# Patient Record
Sex: Female | Born: 1970
Health system: Southern US, Community
[De-identification: ages and names within clinical notes are randomized; demographics above are authoritative.]

## PROBLEM LIST (undated history)

## (undated) DIAGNOSIS — L509 Urticaria, unspecified: Secondary | ICD-10-CM

## (undated) DIAGNOSIS — T783XXA Angioneurotic edema, initial encounter: Secondary | ICD-10-CM

## (undated) DIAGNOSIS — L309 Dermatitis, unspecified: Secondary | ICD-10-CM

## (undated) HISTORY — PX: TURBINATE REDUCTION: SHX6157

## (undated) HISTORY — PX: BUNIONECTOMY: SHX129

## (undated) HISTORY — PX: SEPTOPLASTY: SUR1290

## (undated) HISTORY — DX: Urticaria, unspecified: L50.9

## (undated) HISTORY — DX: Angioneurotic edema, initial encounter: T78.3XXA

## (undated) HISTORY — PX: SINOSCOPY: SHX187

## (undated) HISTORY — DX: Dermatitis, unspecified: L30.9

---

## 1995-03-23 HISTORY — PX: TUBAL LIGATION: SHX77

## 1997-03-22 HISTORY — PX: TONSILLECTOMY: SUR1361

## 2008-03-22 HISTORY — PX: OTHER SURGICAL HISTORY: SHX169

## 2017-10-24 MED FILL — MONTELUKAST SOD 10 MG TAB: 10 | 30 days supply | Qty: 30 | Fill #0

## 2017-11-04 MED FILL — CONCERTA 54 MG TABLET ER: 54 | 30 days supply | Qty: 30 | Fill #0

## 2017-11-15 ENCOUNTER — Ambulatory Visit: Payer: 59 | Admitting: Allergy and Immunology

## 2017-11-15 ENCOUNTER — Encounter: Payer: Self-pay | Admitting: Allergy and Immunology

## 2017-11-15 VITALS — BP 128/90 | HR 63 | Temp 98.2°F | Resp 16 | Ht 68.25 in | Wt 181.4 lb

## 2017-11-15 DIAGNOSIS — J3089 Other allergic rhinitis: Secondary | ICD-10-CM | POA: Diagnosis not present

## 2017-11-15 DIAGNOSIS — H1010 Acute atopic conjunctivitis, unspecified eye: Secondary | ICD-10-CM | POA: Insufficient documentation

## 2017-11-15 DIAGNOSIS — H1013 Acute atopic conjunctivitis, bilateral: Secondary | ICD-10-CM | POA: Diagnosis not present

## 2017-11-15 MED ORDER — OLOPATADINE HCL 0.2 % OP SOLN: 1.0000 [drp] | OPHTHALMIC | 5 refills | Status: DC

## 2017-11-15 NOTE — Progress Notes (Signed)
New Patient Note  RE: Nancy Powers MRN: [id number] DOB: [birth year] Date of Office Visit: 11/15/2017  Referring provider: Ardelle Powers, Nancy D, MD Primary care provider: Ardelle Powers, Nancy D, MD  Chief Complaint: Allergic Rhinitis  and Conjunctivitis   History of present illness: Nancy Powers is a 47 y.o. female presenting today for evaluation of allergic rhinitis.  He moved from [place] to [address] [place] in July 2019.  She has started aeroallergen immunotherapy in [place] in May 2019 and had her last injection on July 20.  She believes that she has started to notice therapy and would like to restart immunotherapy.  She has a nasal septum perforation with multiple failed attempts at repair and therefore is unable to use nasal steroid sprays.  She takes levocetirizine as needed to help control her nasal and ocular allergy symptoms.  Assessment and plan: Other allergic rhinitis  The patient will resume aeroallergen immunotherapy under our care.  Risk and benefits have been discussed and consent forms have been signed.  Orders for aeroallergen immunotherapy vials have been written based upon her skin test results from May 2019.  For now, continue appropriate allergen avoidance measures as well as montelukast, levocetirizine, and/or nasal saline rinse as needed.  Return for follow-up in 6 months, or sooner if necessary.  Allergic conjunctivitis  Treatment plan as outlined above for allergic rhinitis.  A prescription has been provided for Pataday, one drop per eye daily as needed.  Eye lubricant drops (i.e., Natural Tears) as needed.   Meds ordered this encounter  Medications  . Olopatadine HCl (PATADAY) 0.2 % SOLN    Sig: Place 1 drop into both eyes 1 day or 1 dose.    Dispense:  1 Bottle    Refill:  5      Physical examination: Blood pressure 128/90, pulse 63, temperature 98.2 F (36.8 C), temperature source Oral, resp. rate 16, height 5' 8.25" (1.734 m), weight 181 lb 6.4  oz (82.3 kg), SpO2 99 %.  General: Alert, interactive, in no acute distress. HEENT: TMs pearly gray, turbinates moderately edematous with clear discharge, post-pharynx mildly erythematous. Neck: Supple without lymphadenopathy. Lungs: Clear to auscultation without wheezing, rhonchi or rales. CV: Normal S1, S2 without murmurs. Abdomen: Nondistended, nontender. Skin: Warm and dry, without lesions or rashes. Extremities:  No clubbing, cyanosis or edema. Neuro:   Grossly intact.  Review of systems:  Review of systems negative except as noted in HPI / PMHx or noted below: Review of Systems  Constitutional: Negative.   HENT: Negative.   Eyes: Negative.   Respiratory: Negative.   Cardiovascular: Negative.   Gastrointestinal: Negative.   Genitourinary: Negative.   Musculoskeletal: Negative.   Skin: Negative.   Neurological: Negative.   Endo/Heme/Allergies: Negative.   Psychiatric/Behavioral: Negative.     Past medical history:  Past Medical History:  Diagnosis Date  . Angio-edema   . Eczema   . Urticaria     Past surgical history:  Past Surgical History:  Procedure Laterality Date  . BUNIONECTOMY    . CERVICAL FUSION N/A 2010  . SEPTOPLASTY    . SEPTOPLASTY REPAIR    . SINOSCOPY    . TONSILLECTOMY    . TONSILLECTOMY Bilateral 1999  . TONSILLECTOMY Bilateral 1999  . TUBAL LIGATION Bilateral 1997  . TURBINATE REDUCTION      Family history: History reviewed. No pertinent family history.  Social history: Social History   Socioeconomic History  . Marital status: Married    Spouse name: Not on file  .  Number of children: Not on file  . Years of education: Not on file  . Highest education level: Not on file  Occupational History  . Not on file  Social Needs  . Financial resource strain: Not on file  . Food insecurity:    Worry: Not on file    Inability: Not on file  . Transportation needs:    Medical: Not on file    Non-medical: Not on file  Tobacco Use  .  Smoking status: Never Smoker  . Smokeless tobacco: Never Used  Substance and Sexual Activity  . Alcohol use: Yes    Comment: Occasionally   . Drug use: Never  . Sexual activity: Not on file  Lifestyle  . Physical activity:    Days per week: Not on file    Minutes per session: Not on file  . Stress: Not on file  Relationships  . Social connections:    Talks on phone: Not on file    Gets together: Not on file    Attends religious service: Not on file    Active member of club or organization: Not on file    Attends meetings of clubs or organizations: Not on file    Relationship status: Not on file  . Intimate partner violence:    Fear of current or ex partner: Not on file    Emotionally abused: Not on file    Physically abused: Not on file    Forced sexual activity: Not on file  Other Topics Concern  . Not on file  Social History Narrative  . Not on file   Environmental History: The patient lives in an apartment with carpeting the bedroom and central air/heat.  She is a non-smoker without pets.  There is no known mold/water damage in the home.  Allergies as of 11/15/2017   Not on File     Medication List        Accurate as of 11/15/17  6:59 PM. Always use your most recent med list.          Fish Oil 1000 MG Caps Take by mouth.   ipratropium 0.03 % nasal spray Commonly known as:  ATROVENT Place 1 spray into both nostrils every 12 (twelve) hours.   levocetirizine 5 MG tablet Commonly known as:  XYZAL Take 5 mg by mouth every evening.   losartan 100 MG tablet Commonly known as:  COZAAR Take 100 mg by mouth daily.   methylphenidate 54 MG CR tablet Commonly known as:  CONCERTA Take 54 mg by mouth every morning.   montelukast 5 MG chewable tablet Commonly known as:  SINGULAIR Chew 5 mg by mouth at bedtime.   NASOGEL Gel Place 1 spray into the nose as needed.   Olopatadine HCl 0.2 % Soln Place 1 drop into both eyes 1 day or 1 dose.   RHINOCORT ALLERGY 32  MCG/ACT nasal spray Generic drug:  budesonide Place 1 spray into both nostrils daily.       Known medication allergies: Not on File  I appreciate the opportunity to take part in Nancy Powers's care. Please do not hesitate to contact me with questions.  Sincerely,   R. Jorene Guest, MD

## 2017-11-15 NOTE — Patient Instructions (Addendum)
Other allergic rhinitis  The patient will resume aeroallergen immunotherapy under our care.  Risk and benefits have been discussed and consent forms have been signed.  Orders for aeroallergen immunotherapy vials have been written based upon her skin test results from May 2019.  For now, continue appropriate allergen avoidance measures as well as montelukast, levocetirizine, and/or nasal saline rinse as needed.  Return for follow-up in 6 months, or sooner if necessary.  Allergic conjunctivitis  Treatment plan as outlined above for allergic rhinitis.  A prescription has been provided for Pataday, one drop per eye daily as needed.  Eye lubricant drops (i.e., Natural Tears) as needed.   Return in about 6 months (around 05/18/2018), or if symptoms worsen or fail to improve.

## 2017-11-15 NOTE — Assessment & Plan Note (Addendum)
   The patient will resume aeroallergen immunotherapy under our care.  Risk and benefits have been discussed and consent forms have been signed.  Orders for aeroallergen immunotherapy vials have been written based upon her skin test results from May 2019.  For now, continue appropriate allergen avoidance measures as well as montelukast, levocetirizine, and/or nasal saline rinse as needed.  Return for follow-up in 6 months, or sooner if necessary.

## 2017-11-15 NOTE — Assessment & Plan Note (Addendum)
   Treatment plan as outlined above for allergic rhinitis.  A prescription has been provided for Pataday, one drop per eye daily as needed.  Eye lubricant drops (i.e., Natural Tears) as needed. 

## 2017-11-16 NOTE — Progress Notes (Signed)
VIALS EXP 11-17-18 

## 2017-11-23 DIAGNOSIS — J301 Allergic rhinitis due to pollen: Secondary | ICD-10-CM

## 2017-11-23 MED FILL — MONTELUKAST SOD 10 MG TAB: 10 | 30 days supply | Qty: 30 | Fill #1

## 2017-11-24 DIAGNOSIS — J3089 Other allergic rhinitis: Secondary | ICD-10-CM

## 2017-11-24 MED FILL — OLOPATADINE HCL 0.2% EYE DR: 0.2 | 25 days supply | Qty: 3 | Fill #0

## 2017-12-05 ENCOUNTER — Other Ambulatory Visit: Payer: Self-pay | Admitting: *Deleted

## 2017-12-05 MED ORDER — EPINEPHRINE 0.3 MG/0.3ML IJ SOAJ: 0.3000 mg | Freq: Once | INTRAMUSCULAR | 1 refills | Status: AC

## 2017-12-05 MED ORDER — EPINEPHRINE 0.3 MG/0.3ML IJ SOAJ: 0.3000 mg | Freq: Once | INTRAMUSCULAR | 1 refills | Status: DC

## 2017-12-05 NOTE — Addendum Note (Signed)
Addended by: Dub MikesHICKS, ASHLEY N on: 12/05/2017 05:38 PM   Modules accepted: Orders

## 2017-12-06 ENCOUNTER — Other Ambulatory Visit: Payer: Self-pay | Admitting: *Deleted

## 2017-12-06 ENCOUNTER — Ambulatory Visit (INDEPENDENT_AMBULATORY_CARE_PROVIDER_SITE_OTHER): Payer: 59 | Admitting: *Deleted

## 2017-12-06 DIAGNOSIS — J309 Allergic rhinitis, unspecified: Secondary | ICD-10-CM

## 2017-12-06 MED ORDER — EPINEPHRINE 0.3 MG/0.3ML IJ SOAJ: 0.3000 mg | Freq: Once | INTRAMUSCULAR | 0 refills | Status: AC

## 2017-12-06 NOTE — Progress Notes (Signed)
Immunotherapy   Patient Details  Name: Nancy Powers MRN: [id number] Date of Birth: October 22, 1970  12/06/2017  Nancy Powers started injections for  Grass-Weed-Tree-Dmite & Mold-Cat-Cockroach. Following schedule: A  Frequency:2 times per week Epi-Pen:Epi-Pen Available  Consent signed and patient instructions given. No problems after 30 minutes in the office.   Mariane DuvalHeather L Vernon 12/06/2017, 2:56 PM

## 2017-12-13 MED FILL — LOSARTAN POTASSIUM 100 MG T: 100 | 90 days supply | Qty: 90 | Fill #0

## 2017-12-13 MED FILL — CONCERTA 54 MG TABLET ER: 54 | 30 days supply | Qty: 30 | Fill #0

## 2017-12-15 ENCOUNTER — Ambulatory Visit (INDEPENDENT_AMBULATORY_CARE_PROVIDER_SITE_OTHER): Payer: 59 | Admitting: *Deleted

## 2017-12-15 DIAGNOSIS — J309 Allergic rhinitis, unspecified: Secondary | ICD-10-CM

## 2017-12-20 DIAGNOSIS — N6012 Diffuse cystic mastopathy of left breast: Secondary | ICD-10-CM | POA: Diagnosis not present

## 2017-12-20 DIAGNOSIS — F419 Anxiety disorder, unspecified: Secondary | ICD-10-CM | POA: Diagnosis not present

## 2017-12-20 DIAGNOSIS — N6011 Diffuse cystic mastopathy of right breast: Secondary | ICD-10-CM | POA: Diagnosis not present

## 2017-12-20 DIAGNOSIS — I10 Essential (primary) hypertension: Secondary | ICD-10-CM | POA: Diagnosis not present

## 2017-12-20 DIAGNOSIS — F900 Attention-deficit hyperactivity disorder, predominantly inattentive type: Secondary | ICD-10-CM | POA: Diagnosis not present

## 2017-12-20 DIAGNOSIS — N3281 Overactive bladder: Secondary | ICD-10-CM | POA: Diagnosis not present

## 2017-12-20 MED FILL — OXYBUTYNIN CL ER 10 MG TAB: 10 | 30 days supply | Qty: 30 | Fill #0

## 2017-12-20 MED FILL — SERTRALINE HCL 25 MG TABLET: 25 | 30 days supply | Qty: 30 | Fill #0

## 2017-12-21 MED FILL — MONTELUKAST SOD 10 MG TAB: 10 | 90 days supply | Qty: 90 | Fill #2

## 2017-12-22 ENCOUNTER — Other Ambulatory Visit: Payer: Self-pay | Admitting: Family Medicine

## 2017-12-22 ENCOUNTER — Ambulatory Visit (INDEPENDENT_AMBULATORY_CARE_PROVIDER_SITE_OTHER): Payer: 59 | Admitting: *Deleted

## 2017-12-22 DIAGNOSIS — N6011 Diffuse cystic mastopathy of right breast: Secondary | ICD-10-CM

## 2017-12-22 DIAGNOSIS — J309 Allergic rhinitis, unspecified: Secondary | ICD-10-CM | POA: Diagnosis not present

## 2018-01-04 ENCOUNTER — Ambulatory Visit (INDEPENDENT_AMBULATORY_CARE_PROVIDER_SITE_OTHER): Payer: 59 | Admitting: *Deleted

## 2018-01-04 DIAGNOSIS — J309 Allergic rhinitis, unspecified: Secondary | ICD-10-CM

## 2018-01-12 ENCOUNTER — Ambulatory Visit
Admission: RE | Admit: 2018-01-12 | Discharge: 2018-01-12 | Disposition: A | Discharge status: Home / Self Care | Payer: 59 | Source: Ambulatory Visit | Attending: Family Medicine | Admitting: Family Medicine

## 2018-01-12 ENCOUNTER — Ambulatory Visit: Payer: Self-pay

## 2018-01-12 ENCOUNTER — Ambulatory Visit (INDEPENDENT_AMBULATORY_CARE_PROVIDER_SITE_OTHER): Payer: 59

## 2018-01-12 DIAGNOSIS — J309 Allergic rhinitis, unspecified: Secondary | ICD-10-CM | POA: Diagnosis not present

## 2018-01-12 DIAGNOSIS — N888 Other specified noninflammatory disorders of cervix uteri: Secondary | ICD-10-CM | POA: Diagnosis not present

## 2018-01-12 DIAGNOSIS — N6001 Solitary cyst of right breast: Secondary | ICD-10-CM | POA: Diagnosis not present

## 2018-01-12 DIAGNOSIS — R922 Inconclusive mammogram: Secondary | ICD-10-CM | POA: Diagnosis not present

## 2018-01-12 DIAGNOSIS — N898 Other specified noninflammatory disorders of vagina: Secondary | ICD-10-CM | POA: Diagnosis not present

## 2018-01-12 DIAGNOSIS — N6011 Diffuse cystic mastopathy of right breast: Secondary | ICD-10-CM

## 2018-01-18 MED FILL — OXYBUTYNIN CL ER 10 MG TAB: 10 | 30 days supply | Qty: 30 | Fill #1

## 2018-01-18 MED FILL — SERTRALINE HCL 25 MG TABLET: 25 | 30 days supply | Qty: 30 | Fill #1

## 2018-01-19 ENCOUNTER — Ambulatory Visit (INDEPENDENT_AMBULATORY_CARE_PROVIDER_SITE_OTHER): Payer: 59 | Admitting: *Deleted

## 2018-01-19 DIAGNOSIS — J309 Allergic rhinitis, unspecified: Secondary | ICD-10-CM

## 2018-01-26 ENCOUNTER — Telehealth: Payer: Self-pay | Admitting: Allergy and Immunology

## 2018-01-26 ENCOUNTER — Ambulatory Visit (INDEPENDENT_AMBULATORY_CARE_PROVIDER_SITE_OTHER): Payer: 59 | Admitting: *Deleted

## 2018-01-26 DIAGNOSIS — J309 Allergic rhinitis, unspecified: Secondary | ICD-10-CM | POA: Diagnosis not present

## 2018-01-26 MED ORDER — IPRATROPIUM BROMIDE 0.03 % NA SOLN: 1.0000 | Freq: Two times a day (BID) | NASAL | 5 refills | Status: DC

## 2018-01-26 NOTE — Telephone Encounter (Signed)
Pt came up to me and needs to have Atrovent called into [facility] on [address] [phone]

## 2018-01-26 NOTE — Telephone Encounter (Signed)
Script sent into pharmacy. Patient aware.

## 2018-01-27 MED FILL — IPRATROPIUM 0.03% SPRAY: 0.03 | 86 days supply | Qty: 30 | Fill #0

## 2018-02-01 DIAGNOSIS — F900 Attention-deficit hyperactivity disorder, predominantly inattentive type: Secondary | ICD-10-CM | POA: Diagnosis not present

## 2018-02-01 DIAGNOSIS — N3281 Overactive bladder: Secondary | ICD-10-CM | POA: Diagnosis not present

## 2018-02-01 DIAGNOSIS — I10 Essential (primary) hypertension: Secondary | ICD-10-CM | POA: Diagnosis not present

## 2018-02-01 DIAGNOSIS — F419 Anxiety disorder, unspecified: Secondary | ICD-10-CM | POA: Diagnosis not present

## 2018-02-01 MED FILL — CONCERTA 54 MG TABLET ER: 54 | 30 days supply | Qty: 30 | Fill #0

## 2018-02-02 ENCOUNTER — Ambulatory Visit (INDEPENDENT_AMBULATORY_CARE_PROVIDER_SITE_OTHER): Payer: 59 | Admitting: *Deleted

## 2018-02-02 DIAGNOSIS — J309 Allergic rhinitis, unspecified: Secondary | ICD-10-CM | POA: Diagnosis not present

## 2018-02-06 MED FILL — SERTRALINE HCL 50 MG TABLET: 50 | 90 days supply | Qty: 90 | Fill #0

## 2018-02-08 MED FILL — LEVOCETIRIZINE 5 MG TABLET: 5 | 90 days supply | Qty: 90 | Fill #0

## 2018-02-10 ENCOUNTER — Ambulatory Visit (INDEPENDENT_AMBULATORY_CARE_PROVIDER_SITE_OTHER): Payer: 59

## 2018-02-10 DIAGNOSIS — J309 Allergic rhinitis, unspecified: Secondary | ICD-10-CM | POA: Diagnosis not present

## 2018-02-13 MED FILL — OXYBUTYNIN CL ER 10 MG TAB: 10 | 90 days supply | Qty: 90 | Fill #0

## 2018-02-22 ENCOUNTER — Ambulatory Visit (INDEPENDENT_AMBULATORY_CARE_PROVIDER_SITE_OTHER): Payer: 59 | Admitting: *Deleted

## 2018-02-22 DIAGNOSIS — J309 Allergic rhinitis, unspecified: Secondary | ICD-10-CM | POA: Diagnosis not present

## 2018-03-03 ENCOUNTER — Ambulatory Visit (INDEPENDENT_AMBULATORY_CARE_PROVIDER_SITE_OTHER): Payer: 59 | Admitting: *Deleted

## 2018-03-03 DIAGNOSIS — J309 Allergic rhinitis, unspecified: Secondary | ICD-10-CM | POA: Diagnosis not present

## 2018-03-09 ENCOUNTER — Ambulatory Visit (INDEPENDENT_AMBULATORY_CARE_PROVIDER_SITE_OTHER): Payer: 59 | Admitting: *Deleted

## 2018-03-09 DIAGNOSIS — J309 Allergic rhinitis, unspecified: Secondary | ICD-10-CM

## 2018-03-13 MED FILL — LOSARTAN POTASSIUM 100 MG T: 100 | 90 days supply | Qty: 90 | Fill #1

## 2018-03-20 ENCOUNTER — Ambulatory Visit (INDEPENDENT_AMBULATORY_CARE_PROVIDER_SITE_OTHER): Payer: 59 | Admitting: *Deleted

## 2018-03-20 DIAGNOSIS — J309 Allergic rhinitis, unspecified: Secondary | ICD-10-CM

## 2018-03-23 ENCOUNTER — Telehealth: Payer: Self-pay | Admitting: Allergy and Immunology

## 2018-03-23 MED ORDER — MONTELUKAST SODIUM 10 MG PO TABS: 10.0000 mg | ORAL_TABLET | Freq: Every day | ORAL | 1 refills | Status: DC

## 2018-03-23 MED FILL — CONCERTA 54 MG TABLET ER: 54 | 30 days supply | Qty: 30 | Fill #0

## 2018-03-23 MED FILL — MONTELUKAST SOD 10 MG TAB: 10 | 90 days supply | Qty: 90 | Fill #0

## 2018-03-23 NOTE — Telephone Encounter (Signed)
Pt called and needs to have singulair called into [facility] [phone].

## 2018-03-23 NOTE — Telephone Encounter (Signed)
Spoke with patient to confirm dose and frequency of Montelukast.  Patient is request a 90 day supply.

## 2018-03-23 NOTE — Telephone Encounter (Signed)
Called patient left vm advising to call back need clarification on dose she is taking

## 2018-03-30 ENCOUNTER — Ambulatory Visit (INDEPENDENT_AMBULATORY_CARE_PROVIDER_SITE_OTHER): Payer: 59 | Admitting: *Deleted

## 2018-03-30 DIAGNOSIS — J309 Allergic rhinitis, unspecified: Secondary | ICD-10-CM | POA: Diagnosis not present

## 2018-04-04 DIAGNOSIS — J310 Chronic rhinitis: Secondary | ICD-10-CM | POA: Diagnosis not present

## 2018-04-04 DIAGNOSIS — J342 Deviated nasal septum: Secondary | ICD-10-CM | POA: Diagnosis not present

## 2018-04-04 DIAGNOSIS — J3489 Other specified disorders of nose and nasal sinuses: Secondary | ICD-10-CM | POA: Diagnosis not present

## 2018-04-04 MED FILL — AZITHROMYCIN 250 MG TABLET: 250 | 30 days supply | Qty: 30 | Fill #0

## 2018-04-07 ENCOUNTER — Ambulatory Visit (INDEPENDENT_AMBULATORY_CARE_PROVIDER_SITE_OTHER): Payer: 59 | Admitting: *Deleted

## 2018-04-07 DIAGNOSIS — J309 Allergic rhinitis, unspecified: Secondary | ICD-10-CM | POA: Diagnosis not present

## 2018-04-14 ENCOUNTER — Ambulatory Visit (INDEPENDENT_AMBULATORY_CARE_PROVIDER_SITE_OTHER): Payer: 59

## 2018-04-14 DIAGNOSIS — J309 Allergic rhinitis, unspecified: Secondary | ICD-10-CM | POA: Diagnosis not present

## 2018-04-19 DIAGNOSIS — J310 Chronic rhinitis: Secondary | ICD-10-CM | POA: Diagnosis not present

## 2018-04-19 DIAGNOSIS — J3489 Other specified disorders of nose and nasal sinuses: Secondary | ICD-10-CM | POA: Diagnosis not present

## 2018-04-21 ENCOUNTER — Ambulatory Visit (INDEPENDENT_AMBULATORY_CARE_PROVIDER_SITE_OTHER): Payer: 59 | Admitting: *Deleted

## 2018-04-21 DIAGNOSIS — J309 Allergic rhinitis, unspecified: Secondary | ICD-10-CM

## 2018-04-24 ENCOUNTER — Other Ambulatory Visit: Payer: Self-pay | Admitting: Otolaryngology

## 2018-04-24 ENCOUNTER — Other Ambulatory Visit (HOSPITAL_COMMUNITY): Payer: Self-pay | Admitting: Otolaryngology

## 2018-04-24 DIAGNOSIS — J3489 Other specified disorders of nose and nasal sinuses: Secondary | ICD-10-CM

## 2018-04-24 DIAGNOSIS — J320 Chronic maxillary sinusitis: Secondary | ICD-10-CM

## 2018-04-24 DIAGNOSIS — J310 Chronic rhinitis: Secondary | ICD-10-CM

## 2018-04-24 DIAGNOSIS — J342 Deviated nasal septum: Secondary | ICD-10-CM

## 2018-04-27 ENCOUNTER — Ambulatory Visit (INDEPENDENT_AMBULATORY_CARE_PROVIDER_SITE_OTHER): Payer: 59

## 2018-04-27 DIAGNOSIS — J309 Allergic rhinitis, unspecified: Secondary | ICD-10-CM

## 2018-04-28 ENCOUNTER — Ambulatory Visit
Admission: RE | Admit: 2018-04-28 | Discharge: 2018-04-28 | Disposition: A | Discharge status: Home / Self Care | Payer: 59 | Source: Ambulatory Visit | Attending: Otolaryngology | Admitting: Otolaryngology

## 2018-04-28 DIAGNOSIS — J3489 Other specified disorders of nose and nasal sinuses: Secondary | ICD-10-CM | POA: Insufficient documentation

## 2018-04-28 DIAGNOSIS — J342 Deviated nasal septum: Secondary | ICD-10-CM | POA: Diagnosis not present

## 2018-04-28 DIAGNOSIS — R51 Headache: Secondary | ICD-10-CM | POA: Diagnosis not present

## 2018-04-28 DIAGNOSIS — J310 Chronic rhinitis: Secondary | ICD-10-CM | POA: Diagnosis not present

## 2018-04-28 DIAGNOSIS — J320 Chronic maxillary sinusitis: Secondary | ICD-10-CM | POA: Diagnosis not present

## 2018-05-05 ENCOUNTER — Ambulatory Visit (INDEPENDENT_AMBULATORY_CARE_PROVIDER_SITE_OTHER): Payer: 59 | Admitting: *Deleted

## 2018-05-05 DIAGNOSIS — J309 Allergic rhinitis, unspecified: Secondary | ICD-10-CM | POA: Diagnosis not present

## 2018-05-08 MED FILL — SERTRALINE HCL 50 MG TABLET: 50 | 90 days supply | Qty: 90 | Fill #0

## 2018-05-08 MED FILL — CONCERTA 54 MG TABLET ER: 54 | 30 days supply | Qty: 30 | Fill #0

## 2018-05-16 ENCOUNTER — Ambulatory Visit (INDEPENDENT_AMBULATORY_CARE_PROVIDER_SITE_OTHER): Payer: 59 | Admitting: *Deleted

## 2018-05-16 DIAGNOSIS — J309 Allergic rhinitis, unspecified: Secondary | ICD-10-CM

## 2018-05-19 MED FILL — LEVOCETIRIZINE 5 MG TABLET: 5 | 90 days supply | Qty: 90 | Fill #1

## 2018-05-19 MED FILL — OXYBUTYNIN CL ER 10 MG TAB: 10 | 90 days supply | Qty: 90 | Fill #1

## 2018-05-26 ENCOUNTER — Ambulatory Visit (INDEPENDENT_AMBULATORY_CARE_PROVIDER_SITE_OTHER): Payer: 59 | Admitting: *Deleted

## 2018-05-26 DIAGNOSIS — J309 Allergic rhinitis, unspecified: Secondary | ICD-10-CM | POA: Diagnosis not present

## 2018-06-06 ENCOUNTER — Ambulatory Visit (INDEPENDENT_AMBULATORY_CARE_PROVIDER_SITE_OTHER): Payer: 59 | Admitting: *Deleted

## 2018-06-06 DIAGNOSIS — J309 Allergic rhinitis, unspecified: Secondary | ICD-10-CM

## 2018-06-08 MED FILL — LOSARTAN POTASSIUM 100 MG T: 100 | 90 days supply | Qty: 90 | Fill #0

## 2018-06-08 MED FILL — MONTELUKAST SOD 10 MG TAB: 10 | 90 days supply | Qty: 90 | Fill #1

## 2018-06-08 MED FILL — CONCERTA 54 MG TABLET ER: 54 | 30 days supply | Qty: 30 | Fill #0

## 2018-06-19 ENCOUNTER — Ambulatory Visit (INDEPENDENT_AMBULATORY_CARE_PROVIDER_SITE_OTHER): Payer: 59

## 2018-06-19 DIAGNOSIS — J309 Allergic rhinitis, unspecified: Secondary | ICD-10-CM | POA: Diagnosis not present

## 2018-06-24 DIAGNOSIS — R197 Diarrhea, unspecified: Secondary | ICD-10-CM | POA: Diagnosis not present

## 2018-06-24 DIAGNOSIS — R0602 Shortness of breath: Secondary | ICD-10-CM | POA: Diagnosis not present

## 2018-06-24 DIAGNOSIS — B349 Viral infection, unspecified: Secondary | ICD-10-CM | POA: Diagnosis not present

## 2018-07-06 ENCOUNTER — Ambulatory Visit (INDEPENDENT_AMBULATORY_CARE_PROVIDER_SITE_OTHER): Payer: 59 | Admitting: *Deleted

## 2018-07-06 DIAGNOSIS — J309 Allergic rhinitis, unspecified: Secondary | ICD-10-CM | POA: Diagnosis not present

## 2018-07-13 ENCOUNTER — Ambulatory Visit (INDEPENDENT_AMBULATORY_CARE_PROVIDER_SITE_OTHER): Payer: 59 | Admitting: *Deleted

## 2018-07-13 DIAGNOSIS — J309 Allergic rhinitis, unspecified: Secondary | ICD-10-CM

## 2018-07-27 ENCOUNTER — Ambulatory Visit (INDEPENDENT_AMBULATORY_CARE_PROVIDER_SITE_OTHER): Payer: 59

## 2018-07-27 DIAGNOSIS — F900 Attention-deficit hyperactivity disorder, predominantly inattentive type: Secondary | ICD-10-CM | POA: Diagnosis not present

## 2018-07-27 DIAGNOSIS — J309 Allergic rhinitis, unspecified: Secondary | ICD-10-CM

## 2018-07-27 DIAGNOSIS — I10 Essential (primary) hypertension: Secondary | ICD-10-CM | POA: Diagnosis not present

## 2018-07-27 DIAGNOSIS — F419 Anxiety disorder, unspecified: Secondary | ICD-10-CM | POA: Diagnosis not present

## 2018-07-27 DIAGNOSIS — N3281 Overactive bladder: Secondary | ICD-10-CM | POA: Diagnosis not present

## 2018-07-27 MED FILL — CONCERTA 54 MG TABLET ER: 54 | 30 days supply | Qty: 30 | Fill #0

## 2018-07-27 MED FILL — SERTRALINE HCL 50 MG TABLET: 50 | 90 days supply | Qty: 90 | Fill #0

## 2018-08-04 ENCOUNTER — Ambulatory Visit (INDEPENDENT_AMBULATORY_CARE_PROVIDER_SITE_OTHER): Payer: 59 | Admitting: *Deleted

## 2018-08-04 DIAGNOSIS — J309 Allergic rhinitis, unspecified: Secondary | ICD-10-CM

## 2018-08-11 MED FILL — OXYBUTYNIN CL ER 10 MG TAB: 10 | 90 days supply | Qty: 90 | Fill #0

## 2018-08-15 DIAGNOSIS — Z03818 Encounter for observation for suspected exposure to other biological agents ruled out: Secondary | ICD-10-CM | POA: Diagnosis not present

## 2018-08-17 ENCOUNTER — Ambulatory Visit (INDEPENDENT_AMBULATORY_CARE_PROVIDER_SITE_OTHER): Payer: 59

## 2018-08-17 DIAGNOSIS — J309 Allergic rhinitis, unspecified: Secondary | ICD-10-CM | POA: Diagnosis not present

## 2018-08-31 ENCOUNTER — Ambulatory Visit (INDEPENDENT_AMBULATORY_CARE_PROVIDER_SITE_OTHER): Payer: 59 | Admitting: *Deleted

## 2018-08-31 DIAGNOSIS — J309 Allergic rhinitis, unspecified: Secondary | ICD-10-CM | POA: Diagnosis not present

## 2018-09-07 ENCOUNTER — Other Ambulatory Visit: Payer: Self-pay | Admitting: Allergy and Immunology

## 2018-09-07 MED FILL — LEVOCETIRIZINE 5 MG TABLET: 5 | 90 days supply | Qty: 90 | Fill #0

## 2018-09-07 MED FILL — IPRATROPIUM 0.03% SPRAY: 0.03 | 86 days supply | Qty: 30 | Fill #0

## 2018-09-07 MED FILL — LOSARTAN POTASSIUM 100 MG T: 100 | 30 days supply | Qty: 30 | Fill #0

## 2018-09-08 ENCOUNTER — Telehealth: Payer: Self-pay | Admitting: Allergy and Immunology

## 2018-09-08 MED ORDER — MONTELUKAST SODIUM 10 MG PO TABS: 10.0000 mg | ORAL_TABLET | Freq: Every day | ORAL | 0 refills | Status: DC

## 2018-09-08 MED ORDER — LEVOCETIRIZINE DIHYDROCHLORIDE 5 MG PO TABS: 5.0000 mg | ORAL_TABLET | Freq: Every evening | ORAL | 0 refills | Status: DC

## 2018-09-08 MED FILL — MONTELUKAST SOD 10 MG TAB: 10 | 90 days supply | Qty: 90 | Fill #0

## 2018-09-08 NOTE — Telephone Encounter (Signed)
Prescription refills have been sent in as requested.

## 2018-09-08 NOTE — Telephone Encounter (Signed)
Patient made an appointment and is requesting a courtesy refill for Singulair and [facility].

## 2018-09-14 ENCOUNTER — Ambulatory Visit: Payer: 59 | Admitting: Allergy & Immunology

## 2018-09-14 ENCOUNTER — Encounter: Payer: Self-pay | Admitting: Allergy & Immunology

## 2018-09-14 ENCOUNTER — Other Ambulatory Visit: Payer: Self-pay

## 2018-09-14 ENCOUNTER — Ambulatory Visit (INDEPENDENT_AMBULATORY_CARE_PROVIDER_SITE_OTHER): Payer: 59 | Admitting: *Deleted

## 2018-09-14 VITALS — BP 130/92 | HR 68 | Temp 97.7°F | Resp 16 | Ht 69.69 in | Wt 191.4 lb

## 2018-09-14 DIAGNOSIS — J309 Allergic rhinitis, unspecified: Secondary | ICD-10-CM | POA: Diagnosis not present

## 2018-09-14 DIAGNOSIS — H1013 Acute atopic conjunctivitis, bilateral: Secondary | ICD-10-CM | POA: Diagnosis not present

## 2018-09-14 DIAGNOSIS — J3089 Other allergic rhinitis: Secondary | ICD-10-CM | POA: Diagnosis not present

## 2018-09-14 DIAGNOSIS — J302 Other seasonal allergic rhinitis: Secondary | ICD-10-CM

## 2018-09-14 NOTE — Patient Instructions (Addendum)
1. Seasonal and perennial allergic rhinitis - Continue with allergy shots at the same schedule. - Continue with Xyzal 5mg  daily. - Continue with Pataday one drop per eye up to twice daily. - Talk to your optometrist about the eye twitching.  2. Return in about 1 year (around 09/14/2019). This can be an in-person, a virtual Webex or a telephone follow up visit.   Please inform us of any Emergency Department visits, hospitalizations, or changes in symptoms. Call us before going to the ED for breathing or allergy symptoms since we might be able to fit you in for a sick visit. Feel free to contact us anytime with any questions, problems, or concerns.  It was a pleasure to meet you today!  Websites that have reliable patient information: 1. American Academy of Asthma, Allergy, and Immunology: www.aaaai.org 2. Food Allergy Research and Education (FARE): foodallergy.org 3. Mothers of Asthmatics: http://www.asthmacommunitynetwork.org 4. American College of Allergy, Asthma, and Immunology: www.acaai.org  "Like" [place] on Facebook and Instagram for our latest updates!      Make sure you are registered to vote! If you have moved or changed any of your contact information, you will need to get this updated before voting!  In some cases, you MAY be able to register to vote online: [email or web address]    Voter ID laws are NOT going into effect for the General Election in November 2020! DO NOT let this stop you from exercising your right to vote!   Absentee voting is the SAFEST way to vote during the coronavirus pandemic!   Download and print an absentee ballot request form at [email or web address] or you can scan the QR code below with your smart phone:      More information on absentee ballots can be found here: [email or web address]

## 2018-09-14 NOTE — Progress Notes (Signed)
FOLLOW UP  Date of Service/Encounter:  09/14/18   Assessment:   Seasonal and perennial allergic rhinitis (grasses, weeds, trees, mold, cat, cockroach, dust mite)   Allergic conjunctivitis of both eyes  Plan/Recommendations:   1. Seasonal and perennial allergic rhinitis - Continue with allergy shots at the same schedule. - Continue with Xyzal 5mg  daily. - Continue with Pataday one drop per eye up to twice daily. - Talk to your optometrist about the eye twitching.  2. Return in about 1 year (around 09/14/2019).   Subjective:   Nancy Powers is a 48 y.o. female presenting today for follow up of  Chief Complaint  Patient presents with  . Allergic Rhinitis     Nancy Powers has a history of the following: Patient Active Problem List   Diagnosis Date Noted  . Other allergic rhinitis 11/15/2017  . Allergic conjunctivitis 11/15/2017    History obtained from: chart review and patient.  Nancy Powers is a 48 y.o. female presenting for a follow up visit.  She was last seen in August 2019 by Dr. Verlin Fester.  At that time, she was interested in restarting allergen immunotherapy.  She had her vials written based upon her skin testing results from May 2019.  She was continued on Singulair, Xyzal, and nasal saline rinses as needed.  She was given a prescription for Pataday 1 drop per eye daily as needed.  Since last visit, she has done well.  She reports that the allergy shots have made her feel much better compared to how she felt a year ago.  Her original testing was done in [place] in May 2019 before she moved down here.  She has had no problems with advancing her allergen immunotherapy.  She is using her Pataday as needed.  She also remains on the Xyzal.  She has stopped the Singulair.  Nancy Powers is on allergen immunotherapy. She receives two injections. Immunotherapy script #1 contains trees, weeds, grasses and dust mites. She currently receives 0.26mL of the GREEN vial (1/1,000).  Immunotherapy script #2 contains molds, cat and cockroach. She currently receives 0.77mL of the GREEN vial (1/1,000). She started shots September of 2019 and not yet reached maintenance.   She does have a history of large septal perforation. Evidently she had a rhinoplasty that was botched several times which resulted in the perforation. She is going to have this fixed by taking a piece of bone from her skull and placing that as a patch within her nasal septum.   Otherwise, there have been no changes to her past medical history, surgical history, family history, or social history.    Review of Systems  Constitutional: Negative.  Negative for chills, fever, malaise/fatigue and weight loss.  HENT: Negative.  Negative for congestion, ear discharge, ear pain and sore throat.   Eyes: Negative for pain, discharge and redness.       Positive for eye twitching.  Respiratory: Negative for cough, sputum production, shortness of breath and wheezing.   Cardiovascular: Negative.  Negative for chest pain and palpitations.  Gastrointestinal: Negative for abdominal pain, heartburn, nausea and vomiting.  Skin: Negative.  Negative for itching and rash.  Neurological: Negative for dizziness and headaches.  Endo/Heme/Allergies: Positive for environmental allergies. Does not bruise/bleed easily.       Objective:   Blood pressure (!) 130/92, pulse 68, temperature 97.7 F (36.5 C), temperature source Tympanic, resp. rate 16, height 5' 9.69" (1.77 m), weight 191 lb 6.4 oz (86.8 kg), last menstrual period 09/06/2018, SpO2  96 %. Body mass index is 27.71 kg/m.   Physical Exam:  Physical Exam  Constitutional: She appears well-developed.  Pleasant female.   HENT:  Head: Normocephalic and atraumatic.  Right Ear: Tympanic membrane, external ear and ear canal normal.  Left Ear: Tympanic membrane, external ear and ear canal normal.  Nose: Mucosal edema and rhinorrhea present. No nasal deformity or septal  deviation. No epistaxis. Right sinus exhibits no maxillary sinus tenderness and no frontal sinus tenderness. Left sinus exhibits no maxillary sinus tenderness and no frontal sinus tenderness.  Mouth/Throat: Uvula is midline and oropharynx is clear and moist. Mucous membranes are not pale and not dry.  She does have a large septal perforation.   Eyes: Pupils are equal, round, and reactive to light. Conjunctivae and EOM are normal. Right eye exhibits no chemosis and no discharge. Left eye exhibits no chemosis and no discharge. Right conjunctiva is not injected. Left conjunctiva is not injected.  Allergic shiners bilaterally.   Cardiovascular: Normal rate, regular rhythm and normal heart sounds.  Respiratory: Effort normal and breath sounds normal. No accessory muscle usage. No tachypnea. No respiratory distress. She has no wheezes. She has no rhonchi. She has no rales. She exhibits no tenderness.  Moving air well in all lung fields.   Lymphadenopathy:    She has no cervical adenopathy.  Neurological: She is alert.  Skin: No abrasion, no petechiae and no rash noted. Rash is not papular, not vesicular and not urticarial. No erythema. No pallor.  Psychiatric: She has a normal mood and affect.     Diagnostic studies: none   Malachi BondsJoel , MD  [facility]

## 2018-09-28 ENCOUNTER — Ambulatory Visit (INDEPENDENT_AMBULATORY_CARE_PROVIDER_SITE_OTHER): Payer: 59 | Admitting: *Deleted

## 2018-09-28 DIAGNOSIS — J309 Allergic rhinitis, unspecified: Secondary | ICD-10-CM | POA: Diagnosis not present

## 2018-10-02 MED FILL — LOSARTAN POTASSIUM 100 MG T: 100 | 30 days supply | Qty: 30 | Fill #1

## 2018-10-05 ENCOUNTER — Ambulatory Visit (INDEPENDENT_AMBULATORY_CARE_PROVIDER_SITE_OTHER): Payer: 59 | Admitting: *Deleted

## 2018-10-05 DIAGNOSIS — J309 Allergic rhinitis, unspecified: Secondary | ICD-10-CM

## 2018-10-09 MED FILL — CONCERTA 54 MG TABLET ER: 54 | 30 days supply | Qty: 30 | Fill #0

## 2018-10-11 DIAGNOSIS — Z01818 Encounter for other preprocedural examination: Secondary | ICD-10-CM | POA: Diagnosis not present

## 2018-10-11 DIAGNOSIS — J310 Chronic rhinitis: Secondary | ICD-10-CM | POA: Diagnosis not present

## 2018-10-11 DIAGNOSIS — Z9889 Other specified postprocedural states: Secondary | ICD-10-CM | POA: Diagnosis not present

## 2018-10-11 DIAGNOSIS — Z79899 Other long term (current) drug therapy: Secondary | ICD-10-CM | POA: Diagnosis not present

## 2018-10-11 DIAGNOSIS — M170 Bilateral primary osteoarthritis of knee: Secondary | ICD-10-CM | POA: Diagnosis not present

## 2018-10-11 DIAGNOSIS — Z6827 Body mass index (BMI) 27.0-27.9, adult: Secondary | ICD-10-CM | POA: Diagnosis not present

## 2018-10-11 DIAGNOSIS — J3489 Other specified disorders of nose and nasal sinuses: Secondary | ICD-10-CM | POA: Diagnosis not present

## 2018-10-12 ENCOUNTER — Ambulatory Visit (INDEPENDENT_AMBULATORY_CARE_PROVIDER_SITE_OTHER): Payer: 59 | Admitting: *Deleted

## 2018-10-12 DIAGNOSIS — J309 Allergic rhinitis, unspecified: Secondary | ICD-10-CM

## 2018-10-14 DIAGNOSIS — Z1159 Encounter for screening for other viral diseases: Secondary | ICD-10-CM | POA: Diagnosis not present

## 2018-10-16 DIAGNOSIS — J343 Hypertrophy of nasal turbinates: Secondary | ICD-10-CM | POA: Diagnosis not present

## 2018-10-16 DIAGNOSIS — I10 Essential (primary) hypertension: Secondary | ICD-10-CM | POA: Diagnosis not present

## 2018-10-16 DIAGNOSIS — K219 Gastro-esophageal reflux disease without esophagitis: Secondary | ICD-10-CM | POA: Diagnosis not present

## 2018-10-16 DIAGNOSIS — Z9889 Other specified postprocedural states: Secondary | ICD-10-CM | POA: Diagnosis not present

## 2018-10-16 DIAGNOSIS — J3489 Other specified disorders of nose and nasal sinuses: Secondary | ICD-10-CM | POA: Diagnosis not present

## 2018-10-16 DIAGNOSIS — M170 Bilateral primary osteoarthritis of knee: Secondary | ICD-10-CM | POA: Diagnosis not present

## 2018-10-16 DIAGNOSIS — Z79899 Other long term (current) drug therapy: Secondary | ICD-10-CM | POA: Diagnosis not present

## 2018-10-16 DIAGNOSIS — J342 Deviated nasal septum: Secondary | ICD-10-CM | POA: Diagnosis not present

## 2018-10-19 MED FILL — SERTRALINE HCL 50 MG TABLET: 50 | 90 days supply | Qty: 90 | Fill #1

## 2018-10-20 MED FILL — OMEPRAZOLE 40 MG CPDR: 40 | 45 days supply | Qty: 45 | Fill #0

## 2018-10-30 DIAGNOSIS — J3089 Other allergic rhinitis: Secondary | ICD-10-CM | POA: Diagnosis not present

## 2018-10-30 NOTE — Progress Notes (Addendum)
VIALS EXP 10-30-2019 NEEDED GREEN LABELS TOO

## 2018-11-01 MED FILL — LOSARTAN POTASSIUM 100 MG T: 100 | 30 days supply | Qty: 30 | Fill #2

## 2018-11-03 MED FILL — OXYBUTYNIN CL ER 10 MG TAB: 10 | 90 days supply | Qty: 90 | Fill #1

## 2018-11-07 ENCOUNTER — Ambulatory Visit (INDEPENDENT_AMBULATORY_CARE_PROVIDER_SITE_OTHER): Payer: 59 | Admitting: *Deleted

## 2018-11-07 DIAGNOSIS — I10 Essential (primary) hypertension: Secondary | ICD-10-CM | POA: Diagnosis not present

## 2018-11-07 DIAGNOSIS — Z13220 Encounter for screening for lipoid disorders: Secondary | ICD-10-CM | POA: Diagnosis not present

## 2018-11-07 DIAGNOSIS — E559 Vitamin D deficiency, unspecified: Secondary | ICD-10-CM | POA: Diagnosis not present

## 2018-11-07 DIAGNOSIS — J309 Allergic rhinitis, unspecified: Secondary | ICD-10-CM | POA: Diagnosis not present

## 2018-11-07 DIAGNOSIS — Z6827 Body mass index (BMI) 27.0-27.9, adult: Secondary | ICD-10-CM | POA: Diagnosis not present

## 2018-11-07 DIAGNOSIS — Z0000 Encounter for general adult medical examination without abnormal findings: Secondary | ICD-10-CM | POA: Diagnosis not present

## 2018-11-09 ENCOUNTER — Ambulatory Visit (INDEPENDENT_AMBULATORY_CARE_PROVIDER_SITE_OTHER): Payer: 59 | Admitting: *Deleted

## 2018-11-09 DIAGNOSIS — J309 Allergic rhinitis, unspecified: Secondary | ICD-10-CM | POA: Diagnosis not present

## 2018-11-09 MED FILL — AMOX-CLAV 875-125 MG TABLET: 875-125 | 10 days supply | Qty: 20 | Fill #0

## 2018-11-14 ENCOUNTER — Ambulatory Visit (INDEPENDENT_AMBULATORY_CARE_PROVIDER_SITE_OTHER): Payer: 59 | Admitting: *Deleted

## 2018-11-14 DIAGNOSIS — J309 Allergic rhinitis, unspecified: Secondary | ICD-10-CM | POA: Diagnosis not present

## 2018-11-17 ENCOUNTER — Ambulatory Visit (INDEPENDENT_AMBULATORY_CARE_PROVIDER_SITE_OTHER): Payer: 59

## 2018-11-17 DIAGNOSIS — J309 Allergic rhinitis, unspecified: Secondary | ICD-10-CM

## 2018-11-23 ENCOUNTER — Ambulatory Visit (INDEPENDENT_AMBULATORY_CARE_PROVIDER_SITE_OTHER): Payer: 59 | Admitting: *Deleted

## 2018-11-23 DIAGNOSIS — J309 Allergic rhinitis, unspecified: Secondary | ICD-10-CM | POA: Diagnosis not present

## 2018-11-28 MED FILL — IPRATROPIUM 0.03% SPRAY: 0.03 | 86 days supply | Qty: 30 | Fill #1

## 2018-11-30 MED FILL — LOSARTAN POTASSIUM 100 MG T: 100 | 30 days supply | Qty: 30 | Fill #0

## 2018-12-01 ENCOUNTER — Ambulatory Visit (INDEPENDENT_AMBULATORY_CARE_PROVIDER_SITE_OTHER): Payer: 59

## 2018-12-01 DIAGNOSIS — J309 Allergic rhinitis, unspecified: Secondary | ICD-10-CM | POA: Diagnosis not present

## 2018-12-12 ENCOUNTER — Ambulatory Visit (INDEPENDENT_AMBULATORY_CARE_PROVIDER_SITE_OTHER): Payer: 59 | Admitting: *Deleted

## 2018-12-12 DIAGNOSIS — J309 Allergic rhinitis, unspecified: Secondary | ICD-10-CM

## 2018-12-13 ENCOUNTER — Other Ambulatory Visit: Payer: Self-pay | Admitting: Allergy and Immunology

## 2018-12-13 MED FILL — MONTELUKAST SOD 10 MG TAB: 10 | 30 days supply | Qty: 30 | Fill #0

## 2018-12-13 MED FILL — LOSARTAN POTASSIUM 100 MG T: 100 | 30 days supply | Qty: 30 | Fill #0

## 2018-12-13 MED FILL — LEVOCETIRIZINE 5 MG TABLET: 5 | 30 days supply | Qty: 30 | Fill #0

## 2018-12-21 ENCOUNTER — Ambulatory Visit (INDEPENDENT_AMBULATORY_CARE_PROVIDER_SITE_OTHER): Payer: 59 | Admitting: *Deleted

## 2018-12-21 DIAGNOSIS — J309 Allergic rhinitis, unspecified: Secondary | ICD-10-CM

## 2018-12-26 ENCOUNTER — Other Ambulatory Visit: Payer: Self-pay | Admitting: Family Medicine

## 2018-12-26 DIAGNOSIS — Z1231 Encounter for screening mammogram for malignant neoplasm of breast: Secondary | ICD-10-CM

## 2018-12-28 ENCOUNTER — Ambulatory Visit (INDEPENDENT_AMBULATORY_CARE_PROVIDER_SITE_OTHER): Payer: 59 | Admitting: *Deleted

## 2018-12-28 DIAGNOSIS — J309 Allergic rhinitis, unspecified: Secondary | ICD-10-CM

## 2019-01-01 MED FILL — CONCERTA 54 MG TABLET ER: 54 | 30 days supply | Qty: 30 | Fill #0

## 2019-01-02 DIAGNOSIS — K5900 Constipation, unspecified: Secondary | ICD-10-CM | POA: Diagnosis not present

## 2019-01-02 DIAGNOSIS — K625 Hemorrhage of anus and rectum: Secondary | ICD-10-CM | POA: Diagnosis not present

## 2019-01-02 DIAGNOSIS — R194 Change in bowel habit: Secondary | ICD-10-CM | POA: Diagnosis not present

## 2019-01-02 MED FILL — MUPIROCIN 2% OINTMENT: 2 | 30 days supply | Qty: 22 | Fill #0

## 2019-01-02 MED FILL — SULFAMETHOXAZOLE-TMP DS TAB: 800-160 | 7 days supply | Qty: 14 | Fill #0

## 2019-01-03 ENCOUNTER — Other Ambulatory Visit: Payer: Self-pay | Admitting: Family Medicine

## 2019-01-03 DIAGNOSIS — N6012 Diffuse cystic mastopathy of left breast: Secondary | ICD-10-CM

## 2019-01-03 DIAGNOSIS — N6011 Diffuse cystic mastopathy of right breast: Secondary | ICD-10-CM

## 2019-01-05 ENCOUNTER — Ambulatory Visit (INDEPENDENT_AMBULATORY_CARE_PROVIDER_SITE_OTHER): Payer: 59

## 2019-01-05 DIAGNOSIS — J309 Allergic rhinitis, unspecified: Secondary | ICD-10-CM

## 2019-01-06 MED FILL — LEVOCETIRIZINE 5 MG TABLET: 5 | 90 days supply | Qty: 90 | Fill #0

## 2019-01-06 MED FILL — MONTELUKAST SOD 10 MG TAB: 10 | 90 days supply | Qty: 90 | Fill #0

## 2019-01-08 ENCOUNTER — Other Ambulatory Visit: Payer: Self-pay

## 2019-01-08 ENCOUNTER — Encounter: Payer: Self-pay | Admitting: Emergency Medicine

## 2019-01-08 ENCOUNTER — Ambulatory Visit
Admission: EM | Admit: 2019-01-08 | Discharge: 2019-01-08 | Disposition: A | Discharge status: Home / Self Care | Payer: 59 | Attending: Family Medicine | Admitting: Family Medicine

## 2019-01-08 DIAGNOSIS — R519 Headache, unspecified: Secondary | ICD-10-CM

## 2019-01-08 DIAGNOSIS — Z20822 Contact with and (suspected) exposure to covid-19: Secondary | ICD-10-CM

## 2019-01-08 DIAGNOSIS — M545 Low back pain: Secondary | ICD-10-CM

## 2019-01-08 DIAGNOSIS — R509 Fever, unspecified: Secondary | ICD-10-CM | POA: Diagnosis not present

## 2019-01-08 DIAGNOSIS — Z20828 Contact with and (suspected) exposure to other viral communicable diseases: Secondary | ICD-10-CM

## 2019-01-08 NOTE — ED Provider Notes (Signed)
[facility]    CSN: [id number] Arrival date & time: 01/08/19  1017  History   Chief Complaint Chief Complaint  Patient presents with  . Headache  . Fever  . Fatigue   HPI  48 year old female presents with multiple complaints.  Patient reports that she has been sick since yesterday.  She reports headache, fever, chills, back pain, abdominal pain.  Fever T-max 101.5.  She is currently on Bactrim for treatment of a suspected staph infection in her nose.  She is followed closely by ear nose and throat.  Patient denies any recent sick contacts.  She does work in Teacher, music.  She is concerned about possible COVID-19.  She has taken Tylenol with improvement in her fever.  She is currently afebrile at 99.2.  No cough.  No shortness of breath.  No upper respiratory symptoms.  No known inciting factor.  No known exacerbating factors.  No other complaints.  PMH, Surgical Hx, Family Hx, Social History reviewed and updated as below.  PMH: GERD (gastroesophageal reflux disease)    Heart murmur    GI (gastrointestinal bleed)  x 2  ADD (attention deficit disorder)    Seasonal allergies  +ent  IBS (irritable bowel syndrome)    Fibroid    Hypertension     Surgical Hx: Past Surgical History:  Procedure Laterality Date  . BUNIONECTOMY    . CERVICAL FUSION N/A 2010  . SEPTOPLASTY    . SEPTOPLASTY REPAIR    . SINOSCOPY    . TONSILLECTOMY    . TUBAL LIGATION Bilateral 1997  . TURBINATE REDUCTION     OB History   No obstetric history on file.    Home Medications    Prior to Admission medications   Medication Sig Start Date End Date Taking? Authorizing Provider  levocetirizine (XYZAL) 5 MG tablet TAKE 1 TABLET (5 MG TOTAL) BY MOUTH EVERY EVENING. 12/13/18  Yes Alfonse Spruce, MD  losartan (COZAAR) 100 MG tablet Take 100 mg by mouth daily.   Yes [provider]  methylphenidate 54 MG PO CR tablet Take 54 mg by mouth every morning.   Yes [provider]  montelukast (SINGULAIR) 10 MG tablet TAKE 1 TABLET (10 MG TOTAL) BY MOUTH AT BEDTIME. 12/13/18  Yes Alfonse Spruce, MD  Multiple Vitamins-Minerals (MULTIVITAMIN ADULT PO) Take by mouth.   Yes [provider]  mupirocin nasal ointment (BACTROBAN) 2 % Place 1 application into the nose 2 (two) times daily. Use one-half of tube in each nostril twice daily for five (5) days. After application, press sides of nose together and gently massage.   Yes [provider]  Saline (NASOGEL) GEL Place 1 spray into the nose as needed.   Yes [provider]  sertraline (ZOLOFT) 50 MG tablet  07/27/18  Yes [provider]  sulfamethoxazole-trimethoprim (BACTRIM DS) 800-160 MG tablet Take by mouth. 01/02/19 01/09/19 Yes [provider]  budesonide (RHINOCORT ALLERGY) 32 MCG/ACT nasal spray Place 1 spray into both nostrils daily.  01/08/19  [provider]  ipratropium (ATROVENT) 0.03 % nasal spray Place 1 spray into both nostrils every 12 (twelve) hours. 01/26/18 01/08/19  Bobbitt, Heywood Iles, MD    Family History Family History  Problem Relation Age of Onset  . Diabetes Mother   . Osteoarthritis Mother   . Hypertension Mother   . Hypertension Father   . Prostate cancer Father   . Osteoarthritis Father   . Breast cancer Neg Hx  Social History Social History   Tobacco Use  . Smoking status: Never Smoker  . Smokeless tobacco: Never Used  Substance Use Topics  . Alcohol use: Yes    Comment: Occasionally   . Drug use: Never   Allergies   Tape, Other, and Yeast  Review of Systems Review of Systems  Constitutional: Positive for chills and fever.  HENT: Negative.   Respiratory: Negative.   Gastrointestinal: Positive for abdominal pain.  Musculoskeletal: Positive for back pain.  Neurological: Positive for headaches.   Physical Exam Triage Vital Signs ED Triage Vitals  Enc Vitals Group     BP 01/08/19 1032 131/74      Pulse Rate 01/08/19 1032 88     Resp 01/08/19 1032 18     Temp 01/08/19 1032 99.2 F (37.3 C)     Temp Source 01/08/19 1032 Oral     SpO2 01/08/19 1032 99 %     Weight 01/08/19 1033 186 lb (84.4 kg)     Height 01/08/19 1033 5\' 9"  (1.753 m)     Head Circumference --      Peak Flow --      Pain Score 01/08/19 1033 2     Pain Loc --      Pain Edu? --      Excl. in [place]? --    Updated Vital Signs BP 131/74 (BP Location: Left Arm)   Pulse 88   Temp 99.2 F (37.3 C) (Oral)   Resp 18   Ht 5\' 9"  (1.753 m)   Wt 84.4 kg   LMP 12/25/2018 (Approximate)   SpO2 99%   BMI 27.47 kg/m   Visual Acuity Right Eye Distance:   Left Eye Distance:   Bilateral Distance:    Right Eye Near:   Left Eye Near:    Bilateral Near:     Physical Exam Vitals signs and nursing note reviewed.  Constitutional:      General: She is not in acute distress.    Appearance: Normal appearance. She is not ill-appearing.  HENT:     Head: Normocephalic and atraumatic.     Right Ear: Tympanic membrane normal.     Left Ear: Tympanic membrane normal.     Nose: No rhinorrhea.     Mouth/Throat:     Pharynx: Oropharynx is clear. No posterior oropharyngeal erythema.  Eyes:     General:        Right eye: No discharge.        Left eye: No discharge.     Conjunctiva/sclera: Conjunctivae normal.  Cardiovascular:     Rate and Rhythm: Normal rate and regular rhythm.     Heart sounds: No murmur.  Pulmonary:     Effort: Pulmonary effort is normal.     Breath sounds: Normal breath sounds. No wheezing, rhonchi or rales.  Abdominal:     General: There is no distension.     Palpations: Abdomen is soft.     Comments: Mild periumbilical tenderness.  Neurological:     Mental Status: She is alert.  Psychiatric:        Mood and Affect: Mood normal.        Behavior: Behavior normal.    UC Treatments / Results  Labs (all labs ordered are listed, but only abnormal results are displayed) Labs Reviewed  NOVEL  CORONAVIRUS, NAA (HOSPITAL ORDER, SEND-OUT TO REF LAB)    EKG   Radiology No results found.  Procedures Procedures (including critical care time)  Medications Ordered  in UC Medications - No data to display  Initial Impression / Assessment and Plan / UC Course  I have reviewed the triage vital signs and the nursing notes.  Pertinent labs & imaging results that were available during my care of the patient were reviewed by me and considered in my medical decision making (see chart for details).    48 year old female presents with a febrile illness.  Concern for COVID-19.  Awaiting test.  Tylenol Motrin as needed.  Patient to inform health at work.  Work note given.  Supportive care.  Final Clinical Impressions(s) / UC Diagnoses   Final diagnoses:  Suspected COVID-19 virus infection     Discharge Instructions     Rest. Fluids.  Tylenol and motrin as needed.  We will inform of results as soon as they are available.  [facility].  Take care  Dr. Adriana Simasook    ED Prescriptions    None     PDMP not reviewed this encounter.   Tommie SamsCook, Ahlivia Salahuddin G, [place] 01/08/19 1144

## 2019-01-08 NOTE — ED Triage Notes (Addendum)
Patient in today c/o headache, fever (101.5 last night and 100.7 this morning) and fatigue. Patient's last dose of Tylenol was last night. Patient is on antibiotics for suspected staph infection in her nose.

## 2019-01-08 NOTE — Discharge Instructions (Signed)
Rest. Fluids.  Tylenol and motrin as needed.  We will inform of results as soon as they are available.  [facility].  Take care  Dr. Lacinda Axon

## 2019-01-09 DIAGNOSIS — R509 Fever, unspecified: Secondary | ICD-10-CM | POA: Diagnosis not present

## 2019-01-09 DIAGNOSIS — M7989 Other specified soft tissue disorders: Secondary | ICD-10-CM | POA: Diagnosis not present

## 2019-01-09 DIAGNOSIS — R5381 Other malaise: Secondary | ICD-10-CM | POA: Diagnosis not present

## 2019-01-09 DIAGNOSIS — B349 Viral infection, unspecified: Secondary | ICD-10-CM | POA: Diagnosis not present

## 2019-01-09 DIAGNOSIS — Z79899 Other long term (current) drug therapy: Secondary | ICD-10-CM | POA: Diagnosis not present

## 2019-01-09 LAB — NOVEL CORONAVIRUS, NAA (HOSP ORDER, SEND-OUT TO REF LAB; TAT 18-24 HRS): SARS-CoV-2, NAA: NOT DETECTED

## 2019-01-10 DIAGNOSIS — L282 Other prurigo: Secondary | ICD-10-CM | POA: Diagnosis not present

## 2019-01-10 DIAGNOSIS — R509 Fever, unspecified: Secondary | ICD-10-CM | POA: Diagnosis not present

## 2019-01-10 DIAGNOSIS — R6883 Chills (without fever): Secondary | ICD-10-CM | POA: Diagnosis not present

## 2019-01-10 MED FILL — hydrOXYzine HCL 25 MG TABS: 25 | 20 days supply | Qty: 30 | Fill #0

## 2019-01-10 MED FILL — LOSARTAN POTASSIUM 100 MG T: 100 | 90 days supply | Qty: 90 | Fill #1

## 2019-01-15 ENCOUNTER — Other Ambulatory Visit: Payer: Self-pay

## 2019-01-15 ENCOUNTER — Ambulatory Visit: Payer: 59

## 2019-01-15 ENCOUNTER — Ambulatory Visit
Admission: RE | Admit: 2019-01-15 | Discharge: 2019-01-15 | Disposition: A | Discharge status: Home / Self Care | Payer: 59 | Source: Ambulatory Visit | Attending: Family Medicine | Admitting: Family Medicine

## 2019-01-15 DIAGNOSIS — N6012 Diffuse cystic mastopathy of left breast: Secondary | ICD-10-CM

## 2019-01-15 DIAGNOSIS — R928 Other abnormal and inconclusive findings on diagnostic imaging of breast: Secondary | ICD-10-CM | POA: Diagnosis not present

## 2019-01-15 DIAGNOSIS — N6001 Solitary cyst of right breast: Secondary | ICD-10-CM | POA: Diagnosis not present

## 2019-01-15 DIAGNOSIS — N6011 Diffuse cystic mastopathy of right breast: Secondary | ICD-10-CM

## 2019-01-17 MED FILL — SERTRALINE HCL 50 MG TABLET: 50 | 90 days supply | Qty: 90 | Fill #0

## 2019-01-19 DIAGNOSIS — J310 Chronic rhinitis: Secondary | ICD-10-CM | POA: Diagnosis not present

## 2019-01-23 ENCOUNTER — Ambulatory Visit (INDEPENDENT_AMBULATORY_CARE_PROVIDER_SITE_OTHER): Payer: 59 | Admitting: *Deleted

## 2019-01-23 DIAGNOSIS — J309 Allergic rhinitis, unspecified: Secondary | ICD-10-CM | POA: Diagnosis not present

## 2019-01-26 ENCOUNTER — Encounter (HOSPITAL_COMMUNITY): Payer: Self-pay

## 2019-01-26 ENCOUNTER — Ambulatory Visit (HOSPITAL_COMMUNITY)
Admission: EM | Admit: 2019-01-26 | Discharge: 2019-01-26 | Disposition: A | Discharge status: Home / Self Care | Payer: 59 | Attending: Emergency Medicine | Admitting: Emergency Medicine

## 2019-01-26 ENCOUNTER — Other Ambulatory Visit: Payer: Self-pay

## 2019-01-26 DIAGNOSIS — S93601A Unspecified sprain of right foot, initial encounter: Secondary | ICD-10-CM | POA: Diagnosis not present

## 2019-01-26 NOTE — ED Triage Notes (Signed)
Pt states she was walking very high incline in the treadmill 5 days ago the day after she started feeling pain and swelling in her right foot. Pt states she is having burning sensation on her right foot x 1 day.

## 2019-01-26 NOTE — ED Provider Notes (Signed)
[facility]    CSN: [id number] Arrival date & time: 01/26/19  1645      History   Chief Complaint Chief Complaint  Patient presents with  . Appointment    16:40  . Foot Injury    HPI Nancy Powers is a 48 y.o. female.   Patient presents with right foot and ankle swelling and pain x 5 days.  She states it began when she was speed walking at a high incline on her treadmill.  She felt a burning sensation in the area.  She denies numbness, weakness, tingling.  She has been taking Tylenol at home for the discomfort; she is unable to take NSAIDs due to a previous GI issue.  The history is provided by the patient.    Past Medical History:  Diagnosis Date  . Angio-edema   . Eczema   . Urticaria     Patient Active Problem List   Diagnosis Date Noted  . Other allergic rhinitis 11/15/2017  . Allergic conjunctivitis 11/15/2017    Past Surgical History:  Procedure Laterality Date  . BUNIONECTOMY    . CERVICAL FUSION N/A 2010  . SEPTOPLASTY    . SEPTOPLASTY REPAIR    . SINOSCOPY    . TONSILLECTOMY    . TONSILLECTOMY Bilateral 1999  . TONSILLECTOMY Bilateral 1999  . TUBAL LIGATION Bilateral 1997  . TURBINATE REDUCTION      OB History   No obstetric history on file.      Home Medications    Prior to Admission medications   Medication Sig Start Date End Date Taking? Authorizing Provider  levocetirizine (XYZAL) 5 MG tablet TAKE 1 TABLET (5 MG TOTAL) BY MOUTH EVERY EVENING. 12/13/18   Alfonse SpruceGallagher, Joel Louis, MD  losartan (COZAAR) 100 MG tablet Take 100 mg by mouth daily.    [provider]  methylphenidate 54 MG PO CR tablet Take 54 mg by mouth every morning.    [provider]  montelukast (SINGULAIR) 10 MG tablet TAKE 1 TABLET (10 MG TOTAL) BY MOUTH AT BEDTIME. 12/13/18   Alfonse SpruceGallagher, Joel Louis, MD  Multiple Vitamins-Minerals (MULTIVITAMIN ADULT PO) Take by mouth.    [provider]  mupirocin nasal ointment (BACTROBAN) 2 % Place 1  application into the nose 2 (two) times daily. Use one-half of tube in each nostril twice daily for five (5) days. After application, press sides of nose together and gently massage.    [provider]  Saline (NASOGEL) GEL Place 1 spray into the nose as needed.    [provider]  sertraline (ZOLOFT) 50 MG tablet  07/27/18   [provider]  budesonide (RHINOCORT ALLERGY) 32 MCG/ACT nasal spray Place 1 spray into both nostrils daily.  01/08/19  [provider]  ipratropium (ATROVENT) 0.03 % nasal spray Place 1 spray into both nostrils every 12 (twelve) hours. 01/26/18 01/08/19  Bobbitt, Heywood Ilesalph Carter, MD    Family History Family History  Problem Relation Age of Onset  . Diabetes Mother   . Osteoarthritis Mother   . Hypertension Mother   . Hypertension Father   . Prostate cancer Father   . Osteoarthritis Father   . Breast cancer Neg Hx     Social History Social History   Tobacco Use  . Smoking status: Never Smoker  . Smokeless tobacco: Never Used  Substance Use Topics  . Alcohol use: Yes    Comment: Occasionally   . Drug use: Never     Allergies  Tape, Ketorolac, Other, and Yeast   Review of Systems Review of Systems  Constitutional: Negative for chills and fever.  HENT: Negative for ear pain and sore throat.   Eyes: Negative for pain and visual disturbance.  Respiratory: Negative for cough and shortness of breath.   Cardiovascular: Negative for chest pain and palpitations.  Gastrointestinal: Negative for abdominal pain and vomiting.  Genitourinary: Negative for dysuria and hematuria.  Musculoskeletal: Positive for arthralgias. Negative for back pain.  Skin: Negative for color change and rash.  Neurological: Negative for seizures, syncope, weakness and numbness.  All other systems reviewed and are negative.    Physical Exam Triage Vital Signs ED Triage Vitals  Enc Vitals Group     BP      Pulse      Resp      Temp      Temp  src      SpO2      Weight      Height      Head Circumference      Peak Flow      Pain Score      Pain Loc      Pain Edu?      Excl. in GC?    No data found.  Updated Vital Signs BP 133/66 (BP Location: Right Arm)   Pulse 64   Temp 98.4 F (36.9 C) (Oral)   Resp 16   LMP  (Within Weeks) Comment: 2 weeks ago aprox  SpO2 97%   Visual Acuity Right Eye Distance:   Left Eye Distance:   Bilateral Distance:    Right Eye Near:   Left Eye Near:    Bilateral Near:     Physical Exam Vitals signs and nursing note reviewed.  Constitutional:      General: She is not in acute distress.    Appearance: She is well-developed.  HENT:     Head: Normocephalic and atraumatic.  Eyes:     Conjunctiva/sclera: Conjunctivae normal.  Neck:     Musculoskeletal: Neck supple.  Cardiovascular:     Rate and Rhythm: Normal rate and regular rhythm.     Heart sounds: No murmur.  Pulmonary:     Effort: Pulmonary effort is normal. No respiratory distress.     Breath sounds: Normal breath sounds.  Abdominal:     Palpations: Abdomen is soft.     Tenderness: There is no abdominal tenderness.  Musculoskeletal: Normal range of motion.        General: Swelling and tenderness present. No deformity.       Feet:  Skin:    General: Skin is warm and dry.     Capillary Refill: Capillary refill takes less than 2 seconds.     Findings: No bruising, erythema, lesion or rash.  Neurological:     General: No focal deficit present.     Mental Status: She is alert and oriented to person, place, and time.     Sensory: No sensory deficit.     Motor: No weakness.      UC Treatments / Results  Labs (all labs ordered are listed, but only abnormal results are displayed) Labs Reviewed - No data to display  EKG   Radiology No results found.  Procedures Procedures (including critical care time)  Medications Ordered in UC Medications - No data to display  Initial Impression / Assessment and Plan /  UC Course  I have reviewed the triage vital signs and the nursing notes.  Pertinent  labs & imaging results that were available during my care of the patient were reviewed by me and considered in my medical decision making (see chart for details).    Right foot sprain.  Patient is unable to take NSAIDs.  Instructed her to continue Tylenol for discomfort.  Instructed her to rest and elevate her foot, apply ice packs as directed, and wear the Ace wrap as needed for comfort.  Instructed her to call the orthopedist on Monday to schedule an appointment.  Patient agrees to plan of care.     Final Clinical Impressions(s) / UC Diagnoses   Final diagnoses:  Sprain of right foot, initial encounter     Discharge Instructions     Rest and elevate your foot.  Apply ice packs 2-3 times a day for up to 20 minutes each.  Wear the Ace wrap as needed for comfort.    Follow up with an orthopedist if you symptoms continue or worsen;  Or if you develop new symptoms, such as numbness, tingling, or weakness.       ED Prescriptions    None     PDMP not reviewed this encounter.   Sharion Balloon, NP 01/26/19 1733

## 2019-01-26 NOTE — Discharge Instructions (Addendum)
Rest and elevate your foot.  Apply ice packs 2-3 times a day for up to 20 minutes each.  Wear the Ace wrap as needed for comfort.    Follow up with an orthopedist if you symptoms continue or worsen;  Or if you develop new symptoms, such as numbness, tingling, or weakness.

## 2019-01-29 DIAGNOSIS — M2141 Flat foot [pes planus] (acquired), right foot: Secondary | ICD-10-CM | POA: Diagnosis not present

## 2019-01-29 DIAGNOSIS — M7989 Other specified soft tissue disorders: Secondary | ICD-10-CM | POA: Diagnosis not present

## 2019-01-29 DIAGNOSIS — M79671 Pain in right foot: Secondary | ICD-10-CM | POA: Diagnosis not present

## 2019-01-29 DIAGNOSIS — M76821 Posterior tibial tendinitis, right leg: Secondary | ICD-10-CM | POA: Diagnosis not present

## 2019-01-29 DIAGNOSIS — M25571 Pain in right ankle and joints of right foot: Secondary | ICD-10-CM | POA: Diagnosis not present

## 2019-01-29 DIAGNOSIS — M2142 Flat foot [pes planus] (acquired), left foot: Secondary | ICD-10-CM | POA: Diagnosis not present

## 2019-02-03 MED FILL — PEG-3350 SOLUTION: 420 | 1 days supply | Qty: 4000 | Fill #0

## 2019-02-05 ENCOUNTER — Ambulatory Visit: Payer: 59

## 2019-02-06 ENCOUNTER — Ambulatory Visit (INDEPENDENT_AMBULATORY_CARE_PROVIDER_SITE_OTHER): Payer: 59 | Admitting: *Deleted

## 2019-02-06 DIAGNOSIS — J309 Allergic rhinitis, unspecified: Secondary | ICD-10-CM | POA: Diagnosis not present

## 2019-02-13 ENCOUNTER — Ambulatory Visit (INDEPENDENT_AMBULATORY_CARE_PROVIDER_SITE_OTHER): Payer: 59

## 2019-02-13 DIAGNOSIS — J309 Allergic rhinitis, unspecified: Secondary | ICD-10-CM

## 2019-02-20 ENCOUNTER — Telehealth: Payer: Self-pay

## 2019-02-20 ENCOUNTER — Other Ambulatory Visit: Payer: Self-pay

## 2019-02-20 ENCOUNTER — Other Ambulatory Visit: Payer: Self-pay | Admitting: Allergy and Immunology

## 2019-02-20 MED ORDER — IPRATROPIUM BROMIDE 0.06 % NA SOLN: NASAL | 5 refills | Status: AC

## 2019-02-20 MED ORDER — IPRATROPIUM BROMIDE 0.06 % NA SOLN: NASAL | 5 refills | Status: DC

## 2019-02-20 MED FILL — IPRATROPIUM 0.06% SPRAY: 0.06 | 27 days supply | Qty: 15 | Fill #0

## 2019-02-20 NOTE — Telephone Encounter (Signed)
Sent in refill to [facility]

## 2019-02-20 NOTE — Telephone Encounter (Signed)
It is Ok to refill if she felt that this was helping.  Salvatore Marvel, MD [facility]

## 2019-02-20 NOTE — Telephone Encounter (Signed)
Received a refill request from [facility] requesting refill on ipratropium. It looks like this medication was discontinued so I wanted to verify it was okay to refill first. Please advise.

## 2019-02-22 ENCOUNTER — Ambulatory Visit (INDEPENDENT_AMBULATORY_CARE_PROVIDER_SITE_OTHER): Payer: 59 | Admitting: *Deleted

## 2019-02-22 DIAGNOSIS — J309 Allergic rhinitis, unspecified: Secondary | ICD-10-CM | POA: Diagnosis not present

## 2019-02-23 MED FILL — CONCERTA 54 MG TABLET ER: 54 | 30 days supply | Qty: 30 | Fill #0

## 2019-02-28 DIAGNOSIS — Z1159 Encounter for screening for other viral diseases: Secondary | ICD-10-CM | POA: Diagnosis not present

## 2019-03-05 DIAGNOSIS — R110 Nausea: Secondary | ICD-10-CM | POA: Diagnosis not present

## 2019-03-05 DIAGNOSIS — R194 Change in bowel habit: Secondary | ICD-10-CM | POA: Diagnosis not present

## 2019-03-08 ENCOUNTER — Ambulatory Visit (INDEPENDENT_AMBULATORY_CARE_PROVIDER_SITE_OTHER): Payer: 59

## 2019-03-08 DIAGNOSIS — J309 Allergic rhinitis, unspecified: Secondary | ICD-10-CM

## 2019-03-13 ENCOUNTER — Ambulatory Visit (INDEPENDENT_AMBULATORY_CARE_PROVIDER_SITE_OTHER): Payer: 59

## 2019-03-13 DIAGNOSIS — J309 Allergic rhinitis, unspecified: Secondary | ICD-10-CM | POA: Diagnosis not present

## 2019-03-27 ENCOUNTER — Ambulatory Visit (INDEPENDENT_AMBULATORY_CARE_PROVIDER_SITE_OTHER): Payer: 59

## 2019-03-27 DIAGNOSIS — J309 Allergic rhinitis, unspecified: Secondary | ICD-10-CM

## 2019-03-29 ENCOUNTER — Telehealth: Payer: Self-pay | Admitting: Allergy & Immunology

## 2019-03-29 MED FILL — AMOXICILLIN 875 MG TABS: 875 | 7 days supply | Qty: 14 | Fill #0

## 2019-03-29 NOTE — Telephone Encounter (Signed)
Pt called to give the fax number for the [facility] in [place]. [phone]. Need to fax records andvials and injection records.

## 2019-03-30 NOTE — Telephone Encounter (Signed)
I have faxed the "outside provider immunotherapy acknowledgement" to the [facility] in [place], [place]. Will await form return from their office.

## 2019-03-30 NOTE — Telephone Encounter (Signed)
Patient informed that the form had been faxed. I let her know that once we received the form back we would mail the vials to :  [address] [place] [address].   I let her know that we would call her once the form was received.

## 2019-04-02 ENCOUNTER — Ambulatory Visit: Payer: Self-pay

## 2019-04-02 NOTE — Telephone Encounter (Signed)
Called clinic for update. Lady informed me they had patient under as Nancy Powers and not Frier so they though it might have been wrong patient. Forms are being signed and then will be faxed.Will mail out vial once we received signed forms.

## 2019-04-02 NOTE — Telephone Encounter (Signed)
I spoke with Nancy Powers at their office. She told me that they had still not received the paperwork. She told me it would need to be faxed again and that we could put it to her attention.

## 2019-04-03 ENCOUNTER — Ambulatory Visit: Payer: Self-pay

## 2019-04-04 NOTE — Telephone Encounter (Signed)
Called and left a message for office to call our office in regards to this matter.

## 2019-04-05 MED FILL — CONCERTA 54 MG TABLET ER: 54 | 30 days supply | Qty: 30 | Fill #0

## 2019-04-05 NOTE — Telephone Encounter (Signed)
Form received. I am filling out injection log and instruction sheet. I will also go ahead and get the mailer ready for Monday.

## 2019-04-05 NOTE — Telephone Encounter (Signed)
I spoke with Kara Mead at [facility] in [place], [place]. She told me that she would get this sent out today. I am placing patient back on schedule for Monday so that the vials can be shipped out.

## 2019-04-07 MED FILL — LOSARTAN POTASSIUM 100 MG T: 100 | 60 days supply | Qty: 60 | Fill #2

## 2019-04-07 MED FILL — SERTRALINE HCL 50 MG TABLET: 50 | 90 days supply | Qty: 90 | Fill #1

## 2019-04-07 MED FILL — MONTELUKAST SOD 10 MG TAB: 10 | 90 days supply | Qty: 90 | Fill #1

## 2019-04-07 MED FILL — LEVOCETIRIZINE 5 MG TABLET: 5 | 90 days supply | Qty: 90 | Fill #1

## 2019-04-09 ENCOUNTER — Ambulatory Visit: Payer: Self-pay

## 2019-04-09 ENCOUNTER — Ambulatory Visit: Payer: Self-pay | Admitting: *Deleted

## 2019-04-09 ENCOUNTER — Other Ambulatory Visit: Payer: Self-pay

## 2019-04-09 DIAGNOSIS — J309 Allergic rhinitis, unspecified: Secondary | ICD-10-CM

## 2019-04-09 NOTE — Progress Notes (Signed)
Immunotherapy   Patient Details  Name: Nancy Powers MRN: [id number] Date of Birth: 11-26-1970  04/09/2019  [name] here to pick up  G-WEEDS-T-DM, MOLD-C-CR Following schedule: A  Frequency:1 time per week Epi-Pen:Epi-Pen Available  Consent signed and patient instructions given. Patient's Allergy Vials have been mailed to [facility] at [address], [address], [place] [address] along with shot records where patient will continue her allergy injections.    Jarret Torre Fernandez-Vernon 04/09/2019, 12:18 PM

## 2019-04-10 ENCOUNTER — Telehealth: Payer: Self-pay | Admitting: *Deleted

## 2019-04-10 DIAGNOSIS — J301 Allergic rhinitis due to pollen: Secondary | ICD-10-CM | POA: Diagnosis not present

## 2019-04-10 DIAGNOSIS — J3089 Other allergic rhinitis: Secondary | ICD-10-CM | POA: Diagnosis not present

## 2019-04-10 DIAGNOSIS — H1045 Other chronic allergic conjunctivitis: Secondary | ICD-10-CM | POA: Diagnosis not present

## 2019-04-10 DIAGNOSIS — R0981 Nasal congestion: Secondary | ICD-10-CM | POA: Diagnosis not present

## 2019-04-10 DIAGNOSIS — R05 Cough: Secondary | ICD-10-CM | POA: Diagnosis not present

## 2019-04-10 DIAGNOSIS — J343 Hypertrophy of nasal turbinates: Secondary | ICD-10-CM | POA: Diagnosis not present

## 2019-04-10 NOTE — Telephone Encounter (Signed)
Called patient and left a detailed voicemail per DPR permission advising that per our policy we do not mail allergen vials to the patient's home and that they have been mailed to [facility] today at [address], [place] [address]. Asked patient to call back with any questions or concerns. Beth is aware and wanted allergen vials to be mailed to the patient's office instead of the patient's home.

## 2019-04-19 DIAGNOSIS — J301 Allergic rhinitis due to pollen: Secondary | ICD-10-CM | POA: Diagnosis not present

## 2019-04-19 DIAGNOSIS — J3089 Other allergic rhinitis: Secondary | ICD-10-CM | POA: Diagnosis not present

## 2020-10-19 IMAGING — MG DIGITAL DIAGNOSTIC BILAT W/ TOMO
6 of 10 series · 6 of 30 positions shown · non-contrast
Comparison: Previous exam(s).

CLINICAL DATA: Palpable lump and focal pain right breast

EXAM:
DIGITAL DIAGNOSTIC bilateral MAMMOGRAM WITH CAD AND TOMO
ULTRASOUND right BREAST

[R MLO synth-2D]
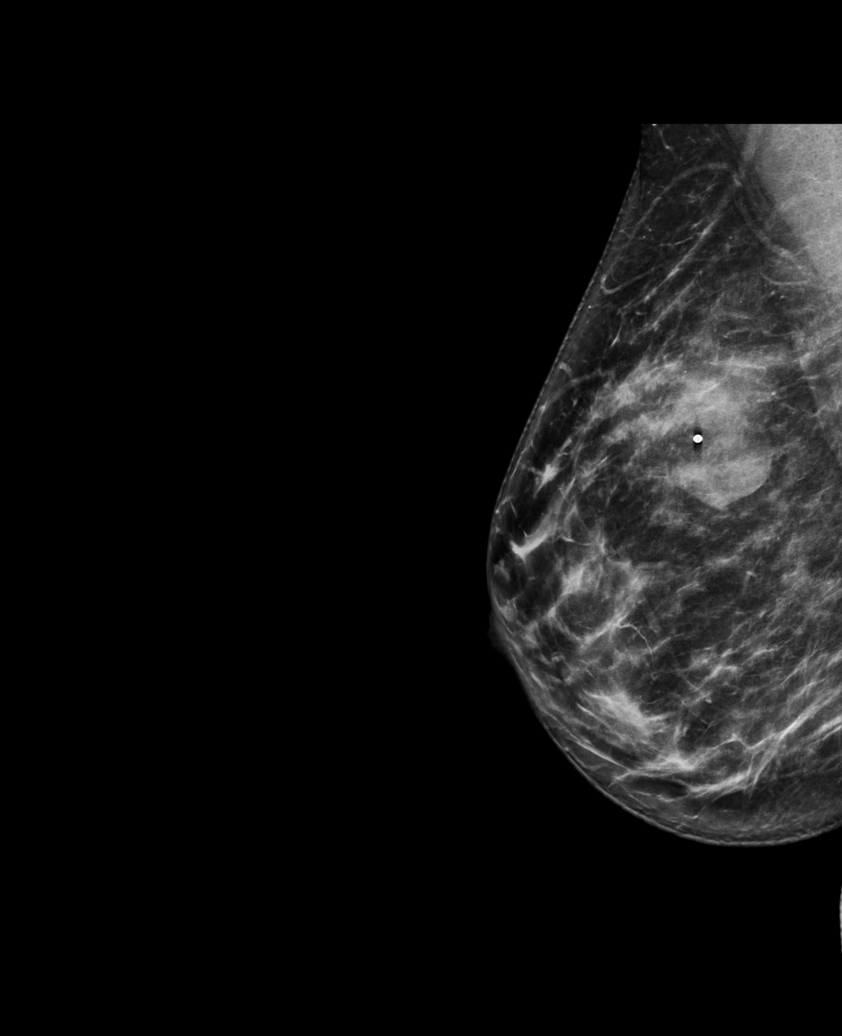

[L CC synth-2D]
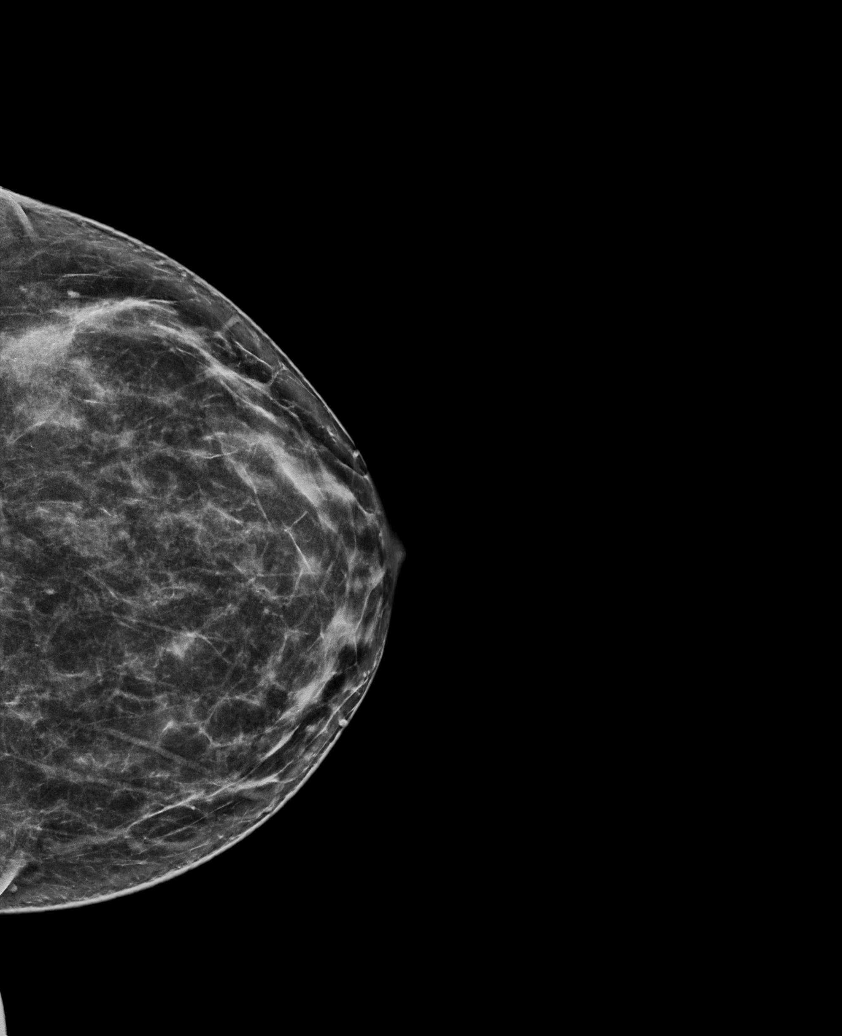

[L MLO synth-2D]
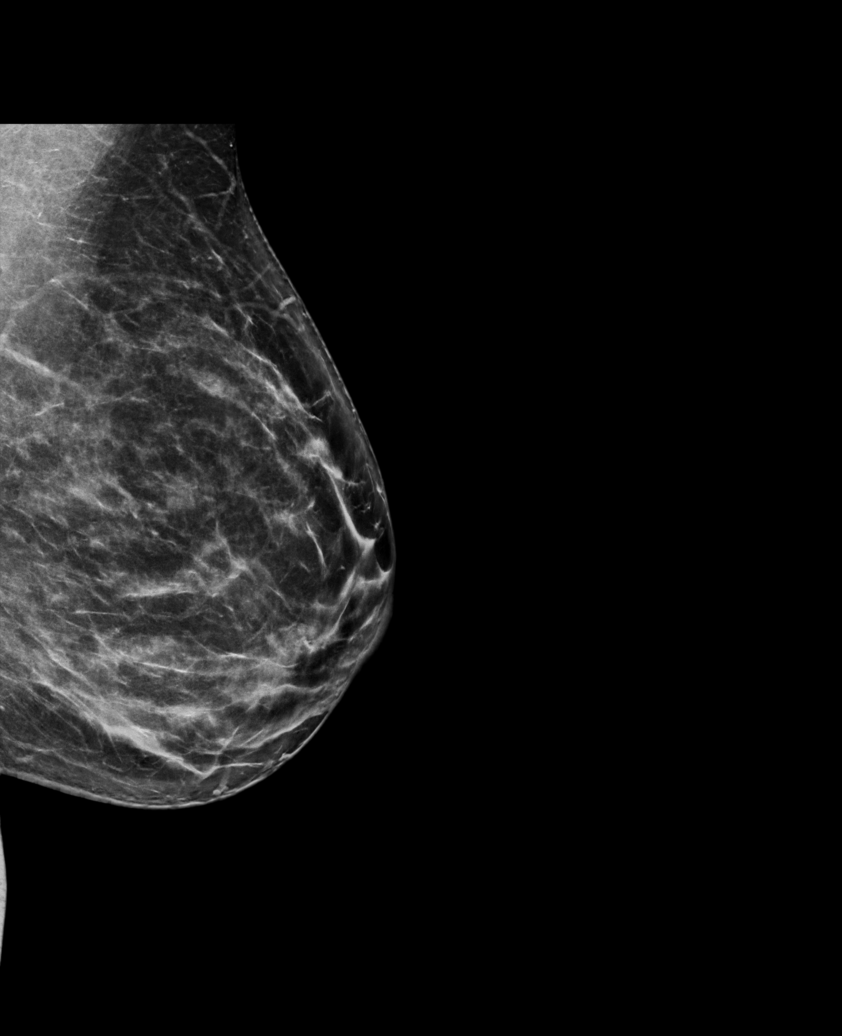

[R TAN synth-2D]
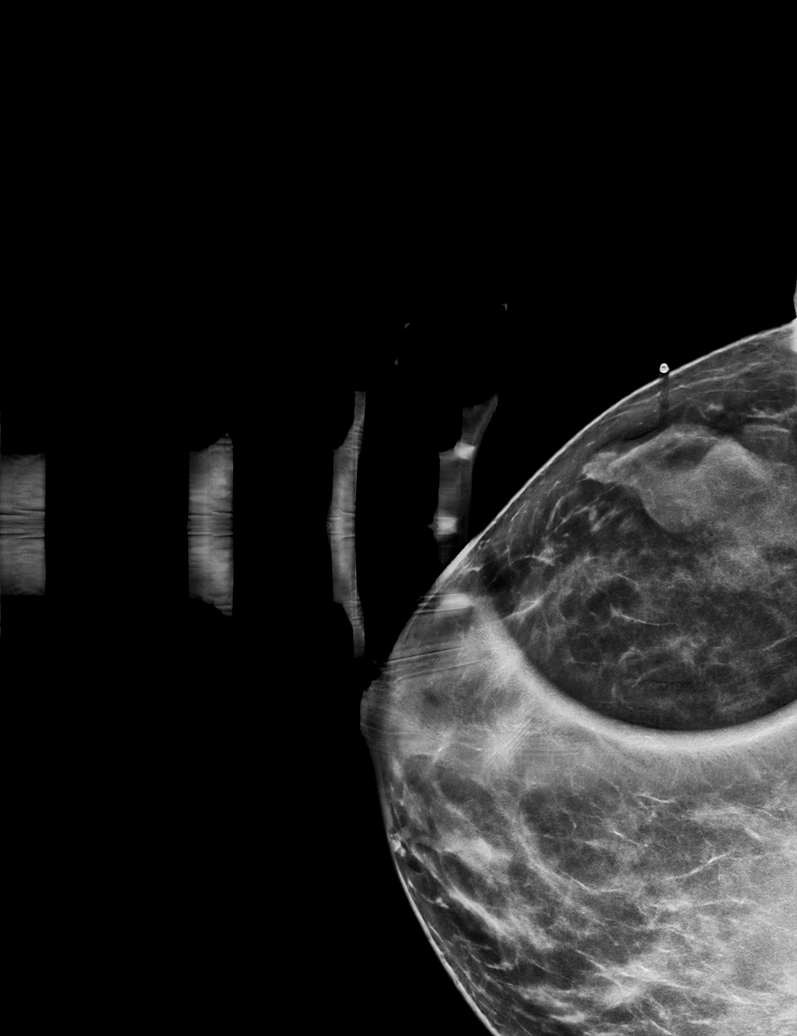

[R CC synth-2D]
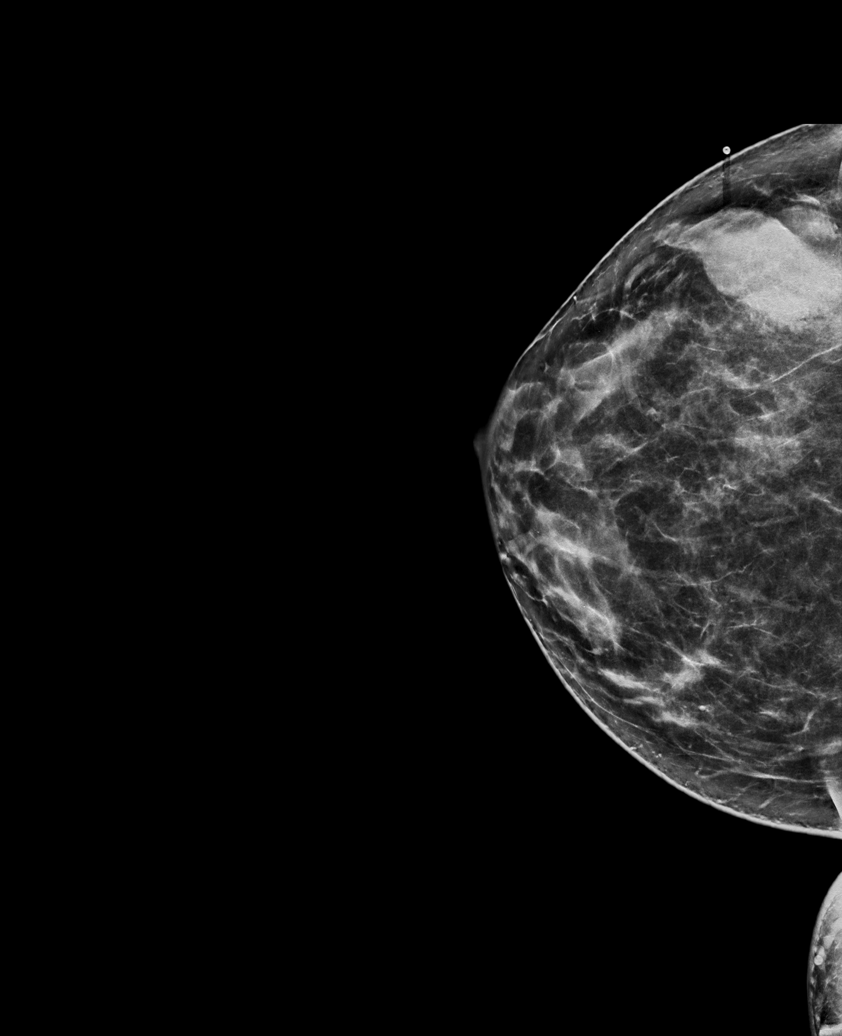

[R TAN tomo · tomo slice 34/67.0]
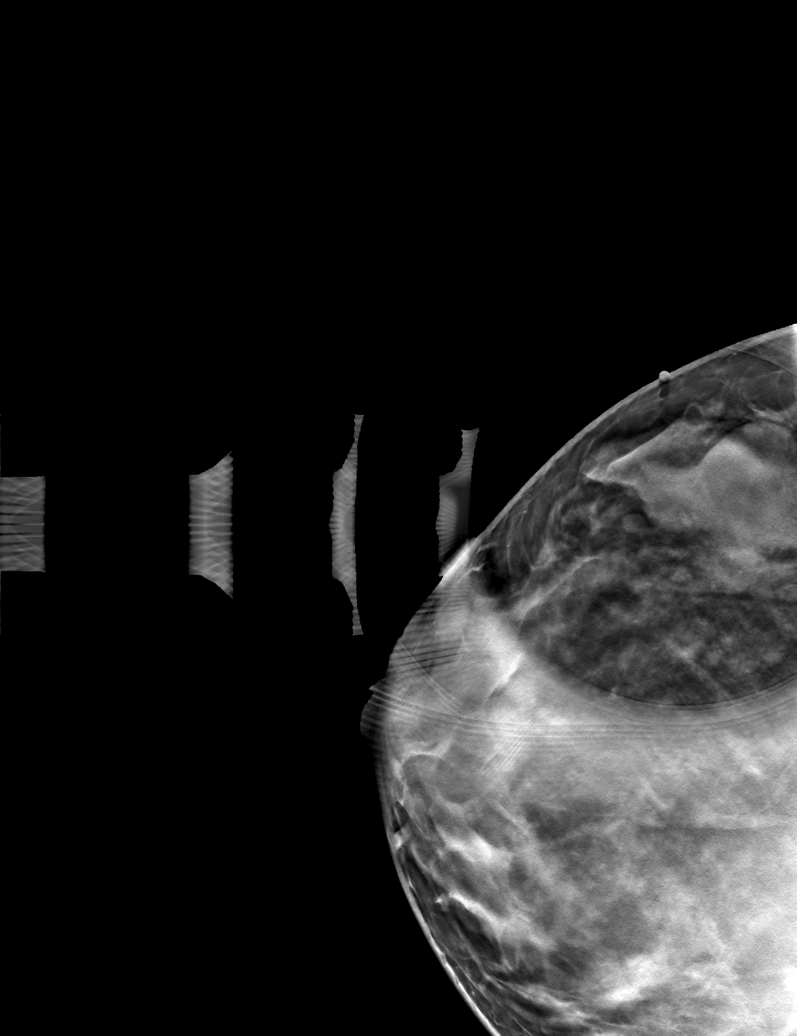

[6 of 30 positions shown; findings below may reference images not displayed]

ACR Breast Density Category b: There are scattered areas of
fibroglandular density.
FINDINGS: Cc and MLO views of bilateral breasts, spot tangential view of right
breast are submitted. At the palpable area/focal area pain, there is
a mass upper-outer quadrant right breast unchanged compared prior
exam. No suspicious abnormalities identified in the left breast.

Mammographic images were processed with CAD.

Targeted ultrasound is performed, showing 3.3 x 1.8 x 3.6 cm simple
cyst at the right breast 10 o'clock 6 cm from nipple palpable area
correlating to the mammographic mass.
IMPRESSION: Benign findings.

RECOMMENDATION:
Routine screening mammogram in 1 year.

I have discussed the findings and recommendations with the patient.
If applicable, a reminder letter will be sent to the patient
regarding the next appointment.

BI-RADS CATEGORY  2: Benign.

## 2021-04-09 DIAGNOSIS — Z79899 Other long term (current) drug therapy: Secondary | ICD-10-CM | POA: Diagnosis not present

## 2021-04-09 DIAGNOSIS — Z0189 Encounter for other specified special examinations: Secondary | ICD-10-CM | POA: Diagnosis not present

## 2021-04-09 DIAGNOSIS — R234 Changes in skin texture: Secondary | ICD-10-CM | POA: Diagnosis not present

## 2021-04-14 DIAGNOSIS — Z0189 Encounter for other specified special examinations: Secondary | ICD-10-CM | POA: Diagnosis not present

## 2021-04-14 DIAGNOSIS — Z79899 Other long term (current) drug therapy: Secondary | ICD-10-CM | POA: Diagnosis not present

## 2021-04-14 DIAGNOSIS — K648 Other hemorrhoids: Secondary | ICD-10-CM | POA: Diagnosis not present

## 2021-04-14 DIAGNOSIS — K581 Irritable bowel syndrome with constipation: Secondary | ICD-10-CM | POA: Diagnosis not present

## 2021-04-14 DIAGNOSIS — K5900 Constipation, unspecified: Secondary | ICD-10-CM | POA: Diagnosis not present

## 2021-04-30 DIAGNOSIS — F900 Attention-deficit hyperactivity disorder, predominantly inattentive type: Secondary | ICD-10-CM | POA: Diagnosis not present

## 2021-04-30 DIAGNOSIS — N3281 Overactive bladder: Secondary | ICD-10-CM | POA: Diagnosis not present

## 2021-04-30 DIAGNOSIS — I10 Essential (primary) hypertension: Secondary | ICD-10-CM | POA: Diagnosis not present

## 2021-04-30 DIAGNOSIS — E559 Vitamin D deficiency, unspecified: Secondary | ICD-10-CM | POA: Diagnosis not present

## 2021-05-11 DIAGNOSIS — R350 Frequency of micturition: Secondary | ICD-10-CM | POA: Diagnosis not present

## 2021-05-11 DIAGNOSIS — K5902 Outlet dysfunction constipation: Secondary | ICD-10-CM | POA: Diagnosis not present

## 2021-05-11 DIAGNOSIS — N3946 Mixed incontinence: Secondary | ICD-10-CM | POA: Diagnosis not present

## 2021-05-18 DIAGNOSIS — L700 Acne vulgaris: Secondary | ICD-10-CM | POA: Diagnosis not present

## 2021-05-18 DIAGNOSIS — Z872 Personal history of diseases of the skin and subcutaneous tissue: Secondary | ICD-10-CM | POA: Diagnosis not present

## 2021-05-18 DIAGNOSIS — M189 Osteoarthritis of first carpometacarpal joint, unspecified: Secondary | ICD-10-CM | POA: Diagnosis not present

## 2021-05-18 DIAGNOSIS — Z79899 Other long term (current) drug therapy: Secondary | ICD-10-CM | POA: Diagnosis not present

## 2021-05-18 DIAGNOSIS — M1812 Unilateral primary osteoarthritis of first carpometacarpal joint, left hand: Secondary | ICD-10-CM | POA: Diagnosis not present

## 2021-05-21 DIAGNOSIS — K581 Irritable bowel syndrome with constipation: Secondary | ICD-10-CM | POA: Diagnosis not present

## 2021-05-21 DIAGNOSIS — K648 Other hemorrhoids: Secondary | ICD-10-CM | POA: Diagnosis not present

## 2021-05-21 DIAGNOSIS — M629 Disorder of muscle, unspecified: Secondary | ICD-10-CM | POA: Diagnosis not present

## 2021-05-21 DIAGNOSIS — K5900 Constipation, unspecified: Secondary | ICD-10-CM | POA: Diagnosis not present

## 2021-06-17 DIAGNOSIS — K648 Other hemorrhoids: Secondary | ICD-10-CM | POA: Diagnosis not present

## 2021-06-17 DIAGNOSIS — M629 Disorder of muscle, unspecified: Secondary | ICD-10-CM | POA: Diagnosis not present

## 2021-06-17 DIAGNOSIS — K5900 Constipation, unspecified: Secondary | ICD-10-CM | POA: Diagnosis not present

## 2021-06-22 DIAGNOSIS — M79645 Pain in left finger(s): Secondary | ICD-10-CM | POA: Diagnosis not present

## 2021-06-22 DIAGNOSIS — M1812 Unilateral primary osteoarthritis of first carpometacarpal joint, left hand: Secondary | ICD-10-CM | POA: Diagnosis not present

## 2021-08-10 DIAGNOSIS — M629 Disorder of muscle, unspecified: Secondary | ICD-10-CM | POA: Diagnosis not present

## 2021-08-10 DIAGNOSIS — K581 Irritable bowel syndrome with constipation: Secondary | ICD-10-CM | POA: Diagnosis not present

## 2021-08-10 DIAGNOSIS — R110 Nausea: Secondary | ICD-10-CM | POA: Diagnosis not present

## 2021-08-10 DIAGNOSIS — K648 Other hemorrhoids: Secondary | ICD-10-CM | POA: Diagnosis not present

## 2021-09-02 DIAGNOSIS — M25361 Other instability, right knee: Secondary | ICD-10-CM | POA: Diagnosis not present

## 2021-09-11 DIAGNOSIS — M25361 Other instability, right knee: Secondary | ICD-10-CM | POA: Diagnosis not present

## 2021-09-23 DIAGNOSIS — M2241 Chondromalacia patellae, right knee: Secondary | ICD-10-CM | POA: Diagnosis not present

## 2021-10-05 DIAGNOSIS — J310 Chronic rhinitis: Secondary | ICD-10-CM | POA: Diagnosis not present

## 2021-10-22 DIAGNOSIS — M170 Bilateral primary osteoarthritis of knee: Secondary | ICD-10-CM | POA: Diagnosis not present

## 2021-10-22 DIAGNOSIS — M1711 Unilateral primary osteoarthritis, right knee: Secondary | ICD-10-CM | POA: Diagnosis not present

## 2021-11-11 DIAGNOSIS — Z13220 Encounter for screening for lipoid disorders: Secondary | ICD-10-CM | POA: Diagnosis not present

## 2021-11-11 DIAGNOSIS — N951 Menopausal and female climacteric states: Secondary | ICD-10-CM | POA: Diagnosis not present

## 2021-11-11 DIAGNOSIS — I10 Essential (primary) hypertension: Secondary | ICD-10-CM | POA: Diagnosis not present

## 2021-11-11 DIAGNOSIS — E559 Vitamin D deficiency, unspecified: Secondary | ICD-10-CM | POA: Diagnosis not present

## 2022-03-03 DIAGNOSIS — K921 Melena: Secondary | ICD-10-CM | POA: Diagnosis not present

## 2022-03-03 DIAGNOSIS — K5909 Other constipation: Secondary | ICD-10-CM | POA: Diagnosis not present

## 2022-03-03 DIAGNOSIS — K649 Unspecified hemorrhoids: Secondary | ICD-10-CM | POA: Diagnosis not present

## 2022-03-19 DIAGNOSIS — K582 Mixed irritable bowel syndrome: Secondary | ICD-10-CM | POA: Diagnosis not present

## 2022-03-19 DIAGNOSIS — K635 Polyp of colon: Secondary | ICD-10-CM | POA: Diagnosis not present

## 2022-03-19 DIAGNOSIS — D125 Benign neoplasm of sigmoid colon: Secondary | ICD-10-CM | POA: Diagnosis not present

## 2022-03-19 DIAGNOSIS — R197 Diarrhea, unspecified: Secondary | ICD-10-CM | POA: Diagnosis not present

## 2022-05-04 DIAGNOSIS — N6012 Diffuse cystic mastopathy of left breast: Secondary | ICD-10-CM | POA: Diagnosis not present

## 2022-05-04 DIAGNOSIS — Z1231 Encounter for screening mammogram for malignant neoplasm of breast: Secondary | ICD-10-CM | POA: Diagnosis not present

## 2022-05-04 DIAGNOSIS — N6011 Diffuse cystic mastopathy of right breast: Secondary | ICD-10-CM | POA: Diagnosis not present

## 2022-06-08 DIAGNOSIS — I10 Essential (primary) hypertension: Secondary | ICD-10-CM | POA: Diagnosis not present

## 2022-06-08 DIAGNOSIS — N3281 Overactive bladder: Secondary | ICD-10-CM | POA: Diagnosis not present

## 2022-06-08 DIAGNOSIS — E559 Vitamin D deficiency, unspecified: Secondary | ICD-10-CM | POA: Diagnosis not present

## 2022-06-08 DIAGNOSIS — F900 Attention-deficit hyperactivity disorder, predominantly inattentive type: Secondary | ICD-10-CM | POA: Diagnosis not present

## 2022-06-09 DIAGNOSIS — L700 Acne vulgaris: Secondary | ICD-10-CM | POA: Diagnosis not present

## 2022-06-14 DIAGNOSIS — S86911A Strain of unspecified muscle(s) and tendon(s) at lower leg level, right leg, initial encounter: Secondary | ICD-10-CM | POA: Diagnosis not present

## 2022-06-22 DIAGNOSIS — N951 Menopausal and female climacteric states: Secondary | ICD-10-CM | POA: Diagnosis not present

## 2022-06-22 DIAGNOSIS — R6882 Decreased libido: Secondary | ICD-10-CM | POA: Diagnosis not present

## 2022-07-06 DIAGNOSIS — B349 Viral infection, unspecified: Secondary | ICD-10-CM | POA: Diagnosis not present

## 2022-07-06 DIAGNOSIS — R059 Cough, unspecified: Secondary | ICD-10-CM | POA: Diagnosis not present

## 2022-08-02 DIAGNOSIS — M76821 Posterior tibial tendinitis, right leg: Secondary | ICD-10-CM | POA: Diagnosis not present

## 2022-08-02 DIAGNOSIS — M79671 Pain in right foot: Secondary | ICD-10-CM | POA: Diagnosis not present

## 2022-08-02 DIAGNOSIS — R262 Difficulty in walking, not elsewhere classified: Secondary | ICD-10-CM | POA: Diagnosis not present

## 2022-08-02 DIAGNOSIS — M722 Plantar fascial fibromatosis: Secondary | ICD-10-CM | POA: Diagnosis not present

## 2022-09-29 DIAGNOSIS — R871 Abnormal level of hormones in specimens from female genital organs: Secondary | ICD-10-CM | POA: Diagnosis not present

## 2022-09-29 DIAGNOSIS — Z79890 Hormone replacement therapy (postmenopausal): Secondary | ICD-10-CM | POA: Diagnosis not present

## 2022-09-29 DIAGNOSIS — R7989 Other specified abnormal findings of blood chemistry: Secondary | ICD-10-CM | POA: Diagnosis not present

## 2022-11-11 DIAGNOSIS — S63062A Subluxation of metacarpal (bone), proximal end of left hand, initial encounter: Secondary | ICD-10-CM | POA: Diagnosis not present

## 2022-11-11 DIAGNOSIS — X58XXXA Exposure to other specified factors, initial encounter: Secondary | ICD-10-CM | POA: Diagnosis not present

## 2022-11-11 DIAGNOSIS — S63041A Subluxation of carpometacarpal joint of right thumb, initial encounter: Secondary | ICD-10-CM | POA: Diagnosis not present

## 2022-11-11 DIAGNOSIS — M79645 Pain in left finger(s): Secondary | ICD-10-CM | POA: Diagnosis not present

## 2022-11-11 DIAGNOSIS — M79641 Pain in right hand: Secondary | ICD-10-CM | POA: Diagnosis not present

## 2022-11-11 DIAGNOSIS — M24241 Disorder of ligament, right hand: Secondary | ICD-10-CM | POA: Diagnosis not present

## 2022-11-11 DIAGNOSIS — M24242 Disorder of ligament, left hand: Secondary | ICD-10-CM | POA: Diagnosis not present

## 2022-12-01 DIAGNOSIS — M79642 Pain in left hand: Secondary | ICD-10-CM | POA: Diagnosis not present

## 2022-12-01 DIAGNOSIS — M24241 Disorder of ligament, right hand: Secondary | ICD-10-CM | POA: Diagnosis not present

## 2022-12-01 DIAGNOSIS — M79641 Pain in right hand: Secondary | ICD-10-CM | POA: Diagnosis not present

## 2022-12-01 DIAGNOSIS — S63061A Subluxation of metacarpal (bone), proximal end of right hand, initial encounter: Secondary | ICD-10-CM | POA: Diagnosis not present

## 2022-12-01 DIAGNOSIS — M24242 Disorder of ligament, left hand: Secondary | ICD-10-CM | POA: Diagnosis not present

## 2022-12-01 DIAGNOSIS — S63062A Subluxation of metacarpal (bone), proximal end of left hand, initial encounter: Secondary | ICD-10-CM | POA: Diagnosis not present

## 2022-12-16 DIAGNOSIS — Z4789 Encounter for other orthopedic aftercare: Secondary | ICD-10-CM | POA: Diagnosis not present

## 2022-12-16 DIAGNOSIS — M24242 Disorder of ligament, left hand: Secondary | ICD-10-CM | POA: Diagnosis not present

## 2022-12-16 DIAGNOSIS — W228XXA Striking against or struck by other objects, initial encounter: Secondary | ICD-10-CM | POA: Diagnosis not present

## 2022-12-16 DIAGNOSIS — M79642 Pain in left hand: Secondary | ICD-10-CM | POA: Diagnosis not present

## 2022-12-16 DIAGNOSIS — S63062A Subluxation of metacarpal (bone), proximal end of left hand, initial encounter: Secondary | ICD-10-CM | POA: Diagnosis not present

## 2022-12-16 DIAGNOSIS — M79641 Pain in right hand: Secondary | ICD-10-CM | POA: Diagnosis not present

## 2022-12-16 DIAGNOSIS — Z981 Arthrodesis status: Secondary | ICD-10-CM | POA: Diagnosis not present

## 2022-12-16 DIAGNOSIS — X58XXXA Exposure to other specified factors, initial encounter: Secondary | ICD-10-CM | POA: Diagnosis not present

## 2022-12-16 DIAGNOSIS — M24241 Disorder of ligament, right hand: Secondary | ICD-10-CM | POA: Diagnosis not present

## 2022-12-23 DIAGNOSIS — I10 Essential (primary) hypertension: Secondary | ICD-10-CM | POA: Diagnosis not present

## 2022-12-23 DIAGNOSIS — E559 Vitamin D deficiency, unspecified: Secondary | ICD-10-CM | POA: Diagnosis not present

## 2022-12-23 DIAGNOSIS — N3281 Overactive bladder: Secondary | ICD-10-CM | POA: Diagnosis not present

## 2022-12-23 DIAGNOSIS — F900 Attention-deficit hyperactivity disorder, predominantly inattentive type: Secondary | ICD-10-CM | POA: Diagnosis not present

## 2022-12-30 DIAGNOSIS — Z981 Arthrodesis status: Secondary | ICD-10-CM | POA: Diagnosis not present

## 2022-12-30 DIAGNOSIS — X58XXXA Exposure to other specified factors, initial encounter: Secondary | ICD-10-CM | POA: Diagnosis not present

## 2022-12-30 DIAGNOSIS — Y831 Surgical operation with implant of artificial internal device as the cause of abnormal reaction of the patient, or of later complication, without mention of misadventure at the time of the procedure: Secondary | ICD-10-CM | POA: Diagnosis not present

## 2022-12-30 DIAGNOSIS — Z4889 Encounter for other specified surgical aftercare: Secondary | ICD-10-CM | POA: Diagnosis not present

## 2022-12-30 DIAGNOSIS — M79642 Pain in left hand: Secondary | ICD-10-CM | POA: Diagnosis not present

## 2022-12-30 DIAGNOSIS — Z4789 Encounter for other orthopedic aftercare: Secondary | ICD-10-CM | POA: Diagnosis not present

## 2022-12-30 DIAGNOSIS — S63062A Subluxation of metacarpal (bone), proximal end of left hand, initial encounter: Secondary | ICD-10-CM | POA: Diagnosis not present

## 2022-12-30 DIAGNOSIS — M24242 Disorder of ligament, left hand: Secondary | ICD-10-CM | POA: Diagnosis not present

## 2022-12-30 DIAGNOSIS — T84210A Breakdown (mechanical) of internal fixation device of bones of hand and fingers, initial encounter: Secondary | ICD-10-CM | POA: Diagnosis not present

## 2022-12-30 DIAGNOSIS — M24241 Disorder of ligament, right hand: Secondary | ICD-10-CM | POA: Diagnosis not present

## 2022-12-30 DIAGNOSIS — T85848A Pain due to other internal prosthetic devices, implants and grafts, initial encounter: Secondary | ICD-10-CM | POA: Diagnosis not present

## 2023-01-20 DIAGNOSIS — Z79890 Hormone replacement therapy (postmenopausal): Secondary | ICD-10-CM | POA: Diagnosis not present

## 2023-01-20 DIAGNOSIS — G8918 Other acute postprocedural pain: Secondary | ICD-10-CM | POA: Diagnosis not present

## 2023-01-20 DIAGNOSIS — Z01818 Encounter for other preprocedural examination: Secondary | ICD-10-CM | POA: Diagnosis not present

## 2023-01-20 DIAGNOSIS — N951 Menopausal and female climacteric states: Secondary | ICD-10-CM | POA: Diagnosis not present

## 2023-01-28 DIAGNOSIS — N952 Postmenopausal atrophic vaginitis: Secondary | ICD-10-CM | POA: Diagnosis not present

## 2023-01-28 DIAGNOSIS — N939 Abnormal uterine and vaginal bleeding, unspecified: Secondary | ICD-10-CM | POA: Diagnosis not present

## 2023-01-28 DIAGNOSIS — N898 Other specified noninflammatory disorders of vagina: Secondary | ICD-10-CM | POA: Diagnosis not present

## 2023-02-02 DIAGNOSIS — T84218A Breakdown (mechanical) of internal fixation device of other bones, initial encounter: Secondary | ICD-10-CM | POA: Diagnosis not present

## 2023-02-02 DIAGNOSIS — Z960 Presence of urogenital implants: Secondary | ICD-10-CM | POA: Diagnosis not present

## 2023-02-02 DIAGNOSIS — T8484XA Pain due to internal orthopedic prosthetic devices, implants and grafts, initial encounter: Secondary | ICD-10-CM | POA: Diagnosis not present

## 2023-02-02 DIAGNOSIS — G8918 Other acute postprocedural pain: Secondary | ICD-10-CM | POA: Diagnosis not present

## 2023-02-02 DIAGNOSIS — M24242 Disorder of ligament, left hand: Secondary | ICD-10-CM | POA: Diagnosis not present

## 2023-02-02 DIAGNOSIS — M24241 Disorder of ligament, right hand: Secondary | ICD-10-CM | POA: Diagnosis not present

## 2023-02-21 DIAGNOSIS — S63062A Subluxation of metacarpal (bone), proximal end of left hand, initial encounter: Secondary | ICD-10-CM | POA: Diagnosis not present

## 2023-02-21 DIAGNOSIS — Z4889 Encounter for other specified surgical aftercare: Secondary | ICD-10-CM | POA: Diagnosis not present

## 2023-02-21 DIAGNOSIS — X58XXXA Exposure to other specified factors, initial encounter: Secondary | ICD-10-CM | POA: Diagnosis not present

## 2023-02-21 DIAGNOSIS — M6281 Muscle weakness (generalized): Secondary | ICD-10-CM | POA: Diagnosis not present

## 2023-02-21 DIAGNOSIS — Z981 Arthrodesis status: Secondary | ICD-10-CM | POA: Diagnosis not present

## 2023-02-21 DIAGNOSIS — M79642 Pain in left hand: Secondary | ICD-10-CM | POA: Diagnosis not present

## 2023-02-28 DIAGNOSIS — M6281 Muscle weakness (generalized): Secondary | ICD-10-CM | POA: Diagnosis not present

## 2023-02-28 DIAGNOSIS — M79642 Pain in left hand: Secondary | ICD-10-CM | POA: Diagnosis not present

## 2023-02-28 DIAGNOSIS — Z4889 Encounter for other specified surgical aftercare: Secondary | ICD-10-CM | POA: Diagnosis not present

## 2023-03-28 DIAGNOSIS — M79642 Pain in left hand: Secondary | ICD-10-CM | POA: Diagnosis not present

## 2023-03-28 DIAGNOSIS — Z4889 Encounter for other specified surgical aftercare: Secondary | ICD-10-CM | POA: Diagnosis not present

## 2023-03-28 DIAGNOSIS — M6281 Muscle weakness (generalized): Secondary | ICD-10-CM | POA: Diagnosis not present

## 2023-03-28 DIAGNOSIS — S63062A Subluxation of metacarpal (bone), proximal end of left hand, initial encounter: Secondary | ICD-10-CM | POA: Diagnosis not present

## 2023-03-28 DIAGNOSIS — X58XXXA Exposure to other specified factors, initial encounter: Secondary | ICD-10-CM | POA: Diagnosis not present

## 2023-03-28 DIAGNOSIS — Z981 Arthrodesis status: Secondary | ICD-10-CM | POA: Diagnosis not present

## 2023-04-06 DIAGNOSIS — Z79890 Hormone replacement therapy (postmenopausal): Secondary | ICD-10-CM | POA: Diagnosis not present

## 2023-04-06 DIAGNOSIS — N951 Menopausal and female climacteric states: Secondary | ICD-10-CM | POA: Diagnosis not present

## 2023-04-06 DIAGNOSIS — R7989 Other specified abnormal findings of blood chemistry: Secondary | ICD-10-CM | POA: Diagnosis not present

## 2023-04-06 DIAGNOSIS — R6882 Decreased libido: Secondary | ICD-10-CM | POA: Diagnosis not present

## 2023-04-06 DIAGNOSIS — R871 Abnormal level of hormones in specimens from female genital organs: Secondary | ICD-10-CM | POA: Diagnosis not present

## 2023-04-06 DIAGNOSIS — Z01419 Encounter for gynecological examination (general) (routine) without abnormal findings: Secondary | ICD-10-CM | POA: Diagnosis not present

## 2023-04-22 DIAGNOSIS — M2550 Pain in unspecified joint: Secondary | ICD-10-CM | POA: Diagnosis not present

## 2023-05-26 DIAGNOSIS — M79642 Pain in left hand: Secondary | ICD-10-CM | POA: Diagnosis not present

## 2023-05-26 DIAGNOSIS — Z4889 Encounter for other specified surgical aftercare: Secondary | ICD-10-CM | POA: Diagnosis not present

## 2023-05-26 DIAGNOSIS — M6281 Muscle weakness (generalized): Secondary | ICD-10-CM | POA: Diagnosis not present

## 2023-06-06 DIAGNOSIS — M542 Cervicalgia: Secondary | ICD-10-CM | POA: Diagnosis not present

## 2023-06-06 DIAGNOSIS — M6281 Muscle weakness (generalized): Secondary | ICD-10-CM | POA: Diagnosis not present

## 2023-06-06 DIAGNOSIS — M25512 Pain in left shoulder: Secondary | ICD-10-CM | POA: Diagnosis not present

## 2023-06-06 DIAGNOSIS — M25511 Pain in right shoulder: Secondary | ICD-10-CM | POA: Diagnosis not present

## 2023-06-13 DIAGNOSIS — M6281 Muscle weakness (generalized): Secondary | ICD-10-CM | POA: Diagnosis not present

## 2023-06-13 DIAGNOSIS — Z4889 Encounter for other specified surgical aftercare: Secondary | ICD-10-CM | POA: Diagnosis not present

## 2023-06-13 DIAGNOSIS — M79642 Pain in left hand: Secondary | ICD-10-CM | POA: Diagnosis not present

## 2023-06-14 DIAGNOSIS — M6281 Muscle weakness (generalized): Secondary | ICD-10-CM | POA: Diagnosis not present

## 2023-06-14 DIAGNOSIS — M542 Cervicalgia: Secondary | ICD-10-CM | POA: Diagnosis not present

## 2023-06-14 DIAGNOSIS — M25511 Pain in right shoulder: Secondary | ICD-10-CM | POA: Diagnosis not present

## 2023-06-14 DIAGNOSIS — M25512 Pain in left shoulder: Secondary | ICD-10-CM | POA: Diagnosis not present

## 2023-06-17 ENCOUNTER — Other Ambulatory Visit: Payer: Self-pay | Admitting: Family Medicine

## 2023-06-17 DIAGNOSIS — Z0000 Encounter for general adult medical examination without abnormal findings: Secondary | ICD-10-CM

## 2023-06-21 DIAGNOSIS — M6281 Muscle weakness (generalized): Secondary | ICD-10-CM | POA: Diagnosis not present

## 2023-06-21 DIAGNOSIS — M542 Cervicalgia: Secondary | ICD-10-CM | POA: Diagnosis not present

## 2023-06-21 DIAGNOSIS — M25511 Pain in right shoulder: Secondary | ICD-10-CM | POA: Diagnosis not present

## 2023-06-21 DIAGNOSIS — M25512 Pain in left shoulder: Secondary | ICD-10-CM | POA: Diagnosis not present

## 2023-06-29 ENCOUNTER — Ambulatory Visit
Admission: RE | Admit: 2023-06-29 | Discharge: 2023-06-29 | Disposition: A | Discharge status: Home / Self Care | Source: Ambulatory Visit | Attending: Family Medicine | Admitting: Family Medicine

## 2023-06-29 DIAGNOSIS — Z1231 Encounter for screening mammogram for malignant neoplasm of breast: Secondary | ICD-10-CM | POA: Diagnosis not present

## 2023-06-29 DIAGNOSIS — Z0000 Encounter for general adult medical examination without abnormal findings: Secondary | ICD-10-CM

## 2023-06-29 DIAGNOSIS — Z79890 Hormone replacement therapy (postmenopausal): Secondary | ICD-10-CM | POA: Diagnosis not present

## 2023-06-29 DIAGNOSIS — N951 Menopausal and female climacteric states: Secondary | ICD-10-CM | POA: Diagnosis not present

## 2023-06-29 DIAGNOSIS — R6882 Decreased libido: Secondary | ICD-10-CM | POA: Diagnosis not present

## 2023-07-04 ENCOUNTER — Other Ambulatory Visit: Payer: Self-pay | Admitting: Family Medicine

## 2023-07-04 DIAGNOSIS — R928 Other abnormal and inconclusive findings on diagnostic imaging of breast: Secondary | ICD-10-CM

## 2023-07-05 ENCOUNTER — Encounter

## 2023-07-05 DIAGNOSIS — M7052 Other bursitis of knee, left knee: Secondary | ICD-10-CM | POA: Diagnosis not present

## 2023-07-15 ENCOUNTER — Ambulatory Visit
Admission: RE | Admit: 2023-07-15 | Discharge: 2023-07-15 | Disposition: A | Discharge status: Home / Self Care | Source: Ambulatory Visit | Attending: Family Medicine | Admitting: Family Medicine

## 2023-07-15 ENCOUNTER — Encounter

## 2023-07-15 DIAGNOSIS — R921 Mammographic calcification found on diagnostic imaging of breast: Secondary | ICD-10-CM | POA: Diagnosis not present

## 2023-07-15 DIAGNOSIS — R928 Other abnormal and inconclusive findings on diagnostic imaging of breast: Secondary | ICD-10-CM

## 2023-07-26 DIAGNOSIS — M7052 Other bursitis of knee, left knee: Secondary | ICD-10-CM | POA: Diagnosis not present

## 2023-08-10 DIAGNOSIS — F419 Anxiety disorder, unspecified: Secondary | ICD-10-CM | POA: Diagnosis not present

## 2023-08-10 DIAGNOSIS — F900 Attention-deficit hyperactivity disorder, predominantly inattentive type: Secondary | ICD-10-CM | POA: Diagnosis not present

## 2023-08-10 DIAGNOSIS — I10 Essential (primary) hypertension: Secondary | ICD-10-CM | POA: Diagnosis not present

## 2023-08-10 DIAGNOSIS — N3281 Overactive bladder: Secondary | ICD-10-CM | POA: Diagnosis not present

## 2023-08-10 DIAGNOSIS — E559 Vitamin D deficiency, unspecified: Secondary | ICD-10-CM | POA: Diagnosis not present

## 2023-08-10 DIAGNOSIS — Z0000 Encounter for general adult medical examination without abnormal findings: Secondary | ICD-10-CM | POA: Diagnosis not present

## 2023-09-02 DIAGNOSIS — T84110A Breakdown (mechanical) of internal fixation device of right humerus, initial encounter: Secondary | ICD-10-CM | POA: Diagnosis not present

## 2023-09-02 DIAGNOSIS — Q796 Ehlers-Danlos syndrome, unspecified: Secondary | ICD-10-CM | POA: Diagnosis not present

## 2023-09-02 DIAGNOSIS — M960 Pseudarthrosis after fusion or arthrodesis: Secondary | ICD-10-CM | POA: Diagnosis not present

## 2023-09-02 DIAGNOSIS — M1812 Unilateral primary osteoarthritis of first carpometacarpal joint, left hand: Secondary | ICD-10-CM | POA: Diagnosis not present

## 2023-09-13 ENCOUNTER — Ambulatory Visit: Admitting: Occupational Therapy

## 2023-09-26 DIAGNOSIS — S62232A Other displaced fracture of base of first metacarpal bone, left hand, initial encounter for closed fracture: Secondary | ICD-10-CM | POA: Diagnosis not present

## 2023-09-26 DIAGNOSIS — Z981 Arthrodesis status: Secondary | ICD-10-CM | POA: Diagnosis not present

## 2023-09-28 DIAGNOSIS — R7989 Other specified abnormal findings of blood chemistry: Secondary | ICD-10-CM | POA: Diagnosis not present

## 2023-09-28 DIAGNOSIS — N951 Menopausal and female climacteric states: Secondary | ICD-10-CM | POA: Diagnosis not present

## 2023-09-28 DIAGNOSIS — Z79890 Hormone replacement therapy (postmenopausal): Secondary | ICD-10-CM | POA: Diagnosis not present

## 2023-09-28 DIAGNOSIS — R6882 Decreased libido: Secondary | ICD-10-CM | POA: Diagnosis not present

## 2023-10-21 DIAGNOSIS — T84110A Breakdown (mechanical) of internal fixation device of right humerus, initial encounter: Secondary | ICD-10-CM | POA: Diagnosis not present

## 2023-10-21 DIAGNOSIS — M960 Pseudarthrosis after fusion or arthrodesis: Secondary | ICD-10-CM | POA: Diagnosis not present

## 2023-10-21 DIAGNOSIS — Q796 Ehlers-Danlos syndrome, unspecified: Secondary | ICD-10-CM | POA: Diagnosis not present

## 2023-11-17 DIAGNOSIS — Z01818 Encounter for other preprocedural examination: Secondary | ICD-10-CM | POA: Diagnosis not present

## 2023-11-17 DIAGNOSIS — T84110A Breakdown (mechanical) of internal fixation device of right humerus, initial encounter: Secondary | ICD-10-CM | POA: Diagnosis not present

## 2023-11-17 DIAGNOSIS — M960 Pseudarthrosis after fusion or arthrodesis: Secondary | ICD-10-CM | POA: Diagnosis not present

## 2023-11-24 DIAGNOSIS — M1812 Unilateral primary osteoarthritis of first carpometacarpal joint, left hand: Secondary | ICD-10-CM | POA: Diagnosis not present

## 2023-11-24 DIAGNOSIS — M960 Pseudarthrosis after fusion or arthrodesis: Secondary | ICD-10-CM | POA: Diagnosis not present

## 2023-11-24 DIAGNOSIS — T8489XA Other specified complication of internal orthopedic prosthetic devices, implants and grafts, initial encounter: Secondary | ICD-10-CM | POA: Diagnosis not present

## 2023-11-24 DIAGNOSIS — G8918 Other acute postprocedural pain: Secondary | ICD-10-CM | POA: Diagnosis not present

## 2023-11-24 DIAGNOSIS — M24842 Other specific joint derangements of left hand, not elsewhere classified: Secondary | ICD-10-CM | POA: Diagnosis not present

## 2023-11-24 DIAGNOSIS — T84110A Breakdown (mechanical) of internal fixation device of right humerus, initial encounter: Secondary | ICD-10-CM | POA: Diagnosis not present

## 2023-11-24 DIAGNOSIS — S63642A Sprain of metacarpophalangeal joint of left thumb, initial encounter: Secondary | ICD-10-CM | POA: Diagnosis not present

## 2023-12-07 DIAGNOSIS — M79645 Pain in left finger(s): Secondary | ICD-10-CM | POA: Diagnosis not present

## 2023-12-07 DIAGNOSIS — M1812 Unilateral primary osteoarthritis of first carpometacarpal joint, left hand: Secondary | ICD-10-CM | POA: Diagnosis not present

## 2024-01-04 DIAGNOSIS — M79645 Pain in left finger(s): Secondary | ICD-10-CM | POA: Diagnosis not present

## 2024-01-04 DIAGNOSIS — Z4889 Encounter for other specified surgical aftercare: Secondary | ICD-10-CM | POA: Diagnosis not present

## 2024-01-04 DIAGNOSIS — Z981 Arthrodesis status: Secondary | ICD-10-CM | POA: Diagnosis not present

## 2024-01-04 DIAGNOSIS — Z4789 Encounter for other orthopedic aftercare: Secondary | ICD-10-CM | POA: Diagnosis not present

## 2024-01-10 DIAGNOSIS — Z79890 Hormone replacement therapy (postmenopausal): Secondary | ICD-10-CM | POA: Diagnosis not present

## 2024-01-23 DIAGNOSIS — M79645 Pain in left finger(s): Secondary | ICD-10-CM | POA: Diagnosis not present

## 2024-01-26 DIAGNOSIS — L700 Acne vulgaris: Secondary | ICD-10-CM | POA: Diagnosis not present

## 2024-01-26 DIAGNOSIS — L308 Other specified dermatitis: Secondary | ICD-10-CM | POA: Diagnosis not present

## 2024-01-26 DIAGNOSIS — R21 Rash and other nonspecific skin eruption: Secondary | ICD-10-CM | POA: Diagnosis not present

## 2024-02-06 DIAGNOSIS — F900 Attention-deficit hyperactivity disorder, predominantly inattentive type: Secondary | ICD-10-CM | POA: Diagnosis not present

## 2024-02-08 DIAGNOSIS — Z4789 Encounter for other orthopedic aftercare: Secondary | ICD-10-CM | POA: Diagnosis not present

## 2024-02-08 DIAGNOSIS — M79645 Pain in left finger(s): Secondary | ICD-10-CM | POA: Diagnosis not present

## 2024-02-21 DIAGNOSIS — M79645 Pain in left finger(s): Secondary | ICD-10-CM | POA: Diagnosis not present

## 2024-03-07 ENCOUNTER — Ambulatory Visit

## 2024-03-07 ENCOUNTER — Ambulatory Visit: Admitting: Family Medicine

## 2024-03-07 VITALS — BP 118/80 | HR 78 | Ht 69.0 in | Wt 199.0 lb

## 2024-03-07 DIAGNOSIS — Q796 Ehlers-Danlos syndrome, unspecified: Secondary | ICD-10-CM | POA: Insufficient documentation

## 2024-03-07 DIAGNOSIS — M542 Cervicalgia: Secondary | ICD-10-CM

## 2024-03-07 DIAGNOSIS — K589 Irritable bowel syndrome without diarrhea: Secondary | ICD-10-CM | POA: Insufficient documentation

## 2024-03-07 DIAGNOSIS — M25511 Pain in right shoulder: Secondary | ICD-10-CM | POA: Diagnosis not present

## 2024-03-07 DIAGNOSIS — M4802 Spinal stenosis, cervical region: Secondary | ICD-10-CM | POA: Diagnosis not present

## 2024-03-07 DIAGNOSIS — K638219 Small intestinal bacterial overgrowth, unspecified: Secondary | ICD-10-CM | POA: Insufficient documentation

## 2024-03-07 DIAGNOSIS — G8929 Other chronic pain: Secondary | ICD-10-CM

## 2024-03-07 DIAGNOSIS — Z981 Arthrodesis status: Secondary | ICD-10-CM | POA: Diagnosis not present

## 2024-03-07 DIAGNOSIS — M2550 Pain in unspecified joint: Secondary | ICD-10-CM

## 2024-03-07 DIAGNOSIS — M47812 Spondylosis without myelopathy or radiculopathy, cervical region: Secondary | ICD-10-CM | POA: Diagnosis not present

## 2024-03-07 MED ORDER — NALTREXONE HCL POWD: 1.0000 mg | Freq: Every day | 12 refills | Status: AC

## 2024-03-07 MED ORDER — TIZANIDINE HCL 2 MG PO TABS: 2.0000 mg | ORAL_TABLET | Freq: Three times a day (TID) | ORAL | 1 refills | Status: AC | PRN

## 2024-03-07 NOTE — Patient Instructions (Addendum)
 Thank you for coming in today.   I've sent a prescription for Tizanidine  & Naltrexone  to your pharmacy.  Please get an Xray today before you leave   I've referred you to Physical Therapy.  Let us  know if you don't hear from them in one week.   Check back in 2 months

## 2024-03-07 NOTE — Progress Notes (Signed)
° °  [name], PhD, LAT, ATC acting as a scribe for Artist Lloyd, MD.  Nancy Powers is a 53 y.o. female who presents to [facility] today for R shoulder and neck pain, worsening since November. Pt locates pain to bilateral neck and R shoulder. Has dx of Hypermobile EDS. Concerns for POTS and MCAS.   Radiates: traps, mid back, right arm UE Numbness/tingling: R UE UE Weakness: yes Aggravates: use, desk work Treatments tried: PT, unable to take IBU  Pertinent review of systems: No fevers or chills  Relevant historical information: Hypertension and EDS. history of left thumb surgery.  Patient lives in [place] area and has received physical therapy in [facility].  Exam:  BP 118/80   Pulse 78   Ht 5' 9 (1.753 m)   Wt 199 lb (90.3 kg)   LMP  (LMP Unknown)   SpO2 96%   BMI 29.39 kg/m  General: Well Developed, well nourished, and in no acute distress.   MSK: C-spine: Normal-appearing Nontender to palpation spinal midline.  To palpation cervical paraspinal musculature worse on the right.  Tender palpation right trapezius. Upper extremity strength is intact.    Lab and Radiology Results  X-ray images cervical spine including flexion and extension views obtained today personally and independently interpreted. Anterior fusion intact appearing from C5-C7.  No acute fractures are visible.  Degenerative changes are present at C4-C5.  No isolated single level instability on flexion and extension views. Await formal radiology review     Assessment and Plan: 53 y.o. female with chronic right sided neck pain.  Pain predominantly due to muscle dysfunction and spasm especially when associated with EDS. 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  However a recheck of ergonomics may be helpful.  Plan to refer her back to physical therapy.  As for medications we talked about options.  Will use tizanidine  at  bedtime.  Additionally for her overall pain I have prescribed low-dose naltrexone .  Recheck in 2 months. PDMP not reviewed this encounter. Orders Placed This Encounter  Procedures   DG Cervical Spine With Flex & Extend    Standing Status:   Future    Number of Occurrences:   1    Expiration Date:   03/07/2025    Reason for Exam (SYMPTOM  OR DIAGNOSIS REQUIRED):   cervical instability    Is patient pregnant?:   No    Preferred imaging location?:   [facility]   Ambulatory referral to Physical Therapy    Referral Priority:   Routine    Referral Type:   Physical Medicine    Referral Reason:   Specialty Services Required    Requested Specialty:   Physical Therapy    Number of Visits Requested:   1   Meds ordered this encounter  Medications   tiZANidine  (ZANAFLEX ) 2 MG tablet    Sig: Take 1-2 tablets (2-4 mg total) by mouth every 8 (eight) hours as needed.    Dispense:  60 tablet    Refill:  1   Naltrexone  HCl POWD    Sig: Take 1 mg by mouth daily.    Dispense:  100 g    Refill:  12     Discussed warning signs or symptoms. Please see discharge instructions. Patient expresses understanding.   The above documentation has been reviewed and is accurate and complete Artist Lloyd, M.D.

## 2024-03-12 DIAGNOSIS — L731 Pseudofolliculitis barbae: Secondary | ICD-10-CM | POA: Diagnosis not present

## 2024-03-12 DIAGNOSIS — L700 Acne vulgaris: Secondary | ICD-10-CM | POA: Diagnosis not present

## 2024-03-19 ENCOUNTER — Telehealth: Payer: Self-pay | Admitting: Family Medicine

## 2024-03-19 NOTE — Telephone Encounter (Signed)
 Called [facility] and got their fax number which is [phone]

## 2024-03-19 NOTE — Telephone Encounter (Signed)
 [facility] called for this patient as she received a referral for PT. She said it may have been meant for [facility]. FYI called from [phone]

## 2024-03-22 ENCOUNTER — Ambulatory Visit: Payer: Self-pay | Admitting: Family Medicine

## 2024-03-22 NOTE — Progress Notes (Signed)
 Cervical spine x-ray shows areas of arthritis and possible pinched nerves.  No evidence of cervical instability seen on flexion and extension views which is good news.

## 2024-03-23 MED ORDER — CELECOXIB 200 MG PO CAPS: 200.0000 mg | ORAL_CAPSULE | Freq: Two times a day (BID) | ORAL | 2 refills | Status: AC | PRN
